# Patient Record
Sex: Female | Born: 1955 | Hispanic: No | Smoking: Former smoker
Health system: Western US, Academic
[De-identification: ages and names within clinical notes are randomized; demographics above are authoritative.]

## PROBLEM LIST (undated history)

## (undated) DIAGNOSIS — R2689 Other abnormalities of gait and mobility: Secondary | ICD-10-CM

## (undated) DIAGNOSIS — M1990 Unspecified osteoarthritis, unspecified site: Secondary | ICD-10-CM

## (undated) DIAGNOSIS — Z87898 Personal history of other specified conditions: Secondary | ICD-10-CM

## (undated) DIAGNOSIS — I10 Essential (primary) hypertension: Secondary | ICD-10-CM

## (undated) DIAGNOSIS — F99 Mental disorder, not otherwise specified: Secondary | ICD-10-CM

## (undated) HISTORY — PX: PR LAMINECTOMY W/O FFD 1/2 VERT SEG LUMBAR: 63005

## (undated) HISTORY — PX: PR NEPHRECTOMY PARTIAL: 50240

---

## 2019-04-11 ENCOUNTER — Inpatient Hospital Stay
Admission: AD | Admit: 2019-04-11 | Discharge: 2019-04-17 | DRG: 519 | Disposition: A | Discharge status: Skilled Nursing Facility | Payer: Commercial Managed Care - HMO | Source: Other Acute Inpatient Hospital | Attending: Orthopaedic Surgery | Admitting: Orthopaedic Surgery

## 2019-04-11 ENCOUNTER — Telehealth: Payer: Self-pay | Admitting: Neurology

## 2019-04-11 ENCOUNTER — Telehealth (HOSPITAL_BASED_OUTPATIENT_CLINIC_OR_DEPARTMENT_OTHER): Payer: Commercial Managed Care - HMO | Admitting: Neurology

## 2019-04-11 ENCOUNTER — Inpatient Hospital Stay (HOSPITAL_COMMUNITY): Payer: Commercial Managed Care - HMO

## 2019-04-11 DIAGNOSIS — F329 Major depressive disorder, single episode, unspecified: Secondary | ICD-10-CM | POA: Diagnosis present

## 2019-04-11 DIAGNOSIS — M48061 Spinal stenosis, lumbar region without neurogenic claudication: Secondary | ICD-10-CM

## 2019-04-11 DIAGNOSIS — G8220 Paraplegia, unspecified: Secondary | ICD-10-CM | POA: Diagnosis present

## 2019-04-11 DIAGNOSIS — E785 Hyperlipidemia, unspecified: Secondary | ICD-10-CM | POA: Diagnosis present

## 2019-04-11 DIAGNOSIS — M4802 Spinal stenosis, cervical region: Secondary | ICD-10-CM

## 2019-04-11 DIAGNOSIS — G839 Paralytic syndrome, unspecified: Secondary | ICD-10-CM

## 2019-04-11 DIAGNOSIS — M9983 Other biomechanical lesions of lumbar region: Secondary | ICD-10-CM

## 2019-04-11 DIAGNOSIS — G8222 Paraplegia, incomplete: Secondary | ICD-10-CM

## 2019-04-11 DIAGNOSIS — K5900 Constipation, unspecified: Secondary | ICD-10-CM | POA: Diagnosis present

## 2019-04-11 DIAGNOSIS — M62838 Other muscle spasm: Secondary | ICD-10-CM

## 2019-04-11 DIAGNOSIS — M5136 Other intervertebral disc degeneration, lumbar region: Secondary | ICD-10-CM

## 2019-04-11 DIAGNOSIS — G9589 Other specified diseases of spinal cord: Secondary | ICD-10-CM | POA: Diagnosis present

## 2019-04-11 DIAGNOSIS — E039 Hypothyroidism, unspecified: Secondary | ICD-10-CM | POA: Diagnosis present

## 2019-04-11 DIAGNOSIS — G4700 Insomnia, unspecified: Secondary | ICD-10-CM | POA: Diagnosis present

## 2019-04-11 DIAGNOSIS — F419 Anxiety disorder, unspecified: Secondary | ICD-10-CM | POA: Diagnosis present

## 2019-04-11 DIAGNOSIS — Z87891 Personal history of nicotine dependence: Secondary | ICD-10-CM

## 2019-04-11 DIAGNOSIS — Z6379 Other stressful life events affecting family and household: Secondary | ICD-10-CM

## 2019-04-11 DIAGNOSIS — Z981 Arthrodesis status: Secondary | ICD-10-CM

## 2019-04-11 DIAGNOSIS — R269 Unspecified abnormalities of gait and mobility: Secondary | ICD-10-CM

## 2019-04-11 DIAGNOSIS — Z1159 Encounter for screening for other viral diseases: Secondary | ICD-10-CM

## 2019-04-11 DIAGNOSIS — I10 Essential (primary) hypertension: Secondary | ICD-10-CM | POA: Diagnosis present

## 2019-04-11 HISTORY — DX: Other abnormalities of gait and mobility: R26.89

## 2019-04-11 HISTORY — DX: Personal history of other specified conditions: Z87.898

## 2019-04-11 HISTORY — DX: Essential (primary) hypertension: I10

## 2019-04-11 HISTORY — DX: Unspecified osteoarthritis, unspecified site: M19.90

## 2019-04-11 HISTORY — DX: Mental disorder, not otherwise specified: F99

## 2019-04-11 MED ORDER — ACETAMINOPHEN 325 MG TABLET
650.0000 mg | ORAL_TABLET | ORAL | Status: DC | PRN
Start: 2019-04-11 — End: 2019-04-13
  Administered 2019-04-12 – 2019-04-13 (×2): 650 mg via ORAL
  Filled 2019-04-11 (×2): qty 2, fill #0

## 2019-04-11 MED ORDER — ONDANSETRON HCL (PF) 4 MG/2 ML INJECTION SOLUTION
4.0000 mg | Freq: Two times a day (BID) | INTRAMUSCULAR | Status: DC | PRN
Start: 2019-04-11 — End: 2019-04-17
  Administered 2019-04-12 – 2019-04-13 (×2): 4 mg via INTRAVENOUS
  Filled 2019-04-11: qty 2, fill #0

## 2019-04-11 MED ORDER — METOCLOPRAMIDE 5 MG/ML INJECTION SOLUTION
10.0000 mg | Freq: Four times a day (QID) | INTRAMUSCULAR | Status: DC | PRN
Start: 2019-04-11 — End: 2019-04-17

## 2019-04-11 MED ORDER — HEPARIN (PORCINE) 5,000 UNIT/ML SOLUTION FOR INJECTION
5000.0000 [IU] | Freq: Three times a day (TID) | INTRAMUSCULAR | Status: DC
Start: 2019-04-11 — End: 2019-04-13
  Administered 2019-04-11 – 2019-04-12 (×2): 5000 [IU] via SUBCUTANEOUS
  Filled 2019-04-11 (×2): qty 1, fill #0

## 2019-04-11 MED ORDER — MAGNESIUM HYDROXIDE 400 MG/5 ML ORAL SUSPENSION
30.0000 mL | Freq: Two times a day (BID) | ORAL | Status: DC | PRN
Start: 2019-04-11 — End: 2019-04-13
  Administered 2019-04-12: 30 mL via ORAL
  Filled 2019-04-11: qty 30, fill #0

## 2019-04-11 MED ORDER — DOCUSATE SODIUM 100 MG CAPSULE
100.0000 mg | ORAL_CAPSULE | Freq: Two times a day (BID) | ORAL | Status: DC
Start: 2019-04-11 — End: 2019-04-13
  Administered 2019-04-11 – 2019-04-12 (×3): 100 mg via ORAL
  Filled 2019-04-11 (×3): qty 1, fill #0

## 2019-04-11 NOTE — Progress Notes (Signed)
Video Visit Note     I performed this clinical encounter by utilizing a real time telehealth video connection between my location and the patient's location. The patient's location was confirmed during this visit. I obtained verbal consent from the patient to perform this clinical encounter utilizing video and prepared the patient by answering any questions they had about the telehealth interaction.    Aided by her [facility] ER MD, Dr. Early Chars.    She has had a four day history of progressive leg weakness and spasticity on the background of a remote laminectomy of L3-4. No sensory levels and rectal tone normal on the exam. Her PP and temp sense are reduced at and below the knee, b/l. She is hyper reflexic with clonus LEs, and toes upgoing.  MRI L/S spine: no acute findings verbal report.   I/P: Spastic paraparesis of unknown etiology. Admit.    I spent a total of 44 minutes today on this patient's visit. This included 34 minutes of face to face time, more than 50% of which was spent in counseling or coordinating care, for the following medical problem(s): spasticity of LEs. The remainder of the time was spent on review of the patient record (including but not limited to clinical notes, outside records, laboratory and radiographic studies), medical decision-making, and documentation of the visit.    Ann Held, MD

## 2019-04-11 NOTE — Nurse Assessment (Signed)
ADMIT NURSING NOTE    Note Started: 04/11/2019, 22:57     Report received from [facility].. Patient admitted at 2100 hours as a direct admit and accompanied by transport. Pt condition stable . patient oriented to room and unit. MD came in the unit and saw pt.. safety and security measures provided, Admission Assessment and Plan of Care initiated. Rhonda Horton. Esmeralda Links, RN

## 2019-04-11 NOTE — Progress Notes (Signed)
The ER MD, Dr. Gaynell Face, called this morning, and reported that:     The patient had a four-day leg weakness at home, on the background of social difficulties, and family discord.    PmHX: Low back pain, chronic. NO history of injury or falls.     Examination by report:    Legs are rigid, and hyper extended, but mild to trace weakness.     DTRs: hypo on the left leg and normal on the right    Sensory is fully intact everywhere.     Babinski: not performed.    Impression: acute exacerbation of lower back pain and ?spinal stenosis.    Recommend:  1) check rectal tone,  2) check Babkinski,  3) order MRI L/S spine w/ and w/o,  4) outpatient EMG by local neurology services (Drs. Lanny Cramp, and [facility])  5) admit and observe,  6) call with any questions.     Time spent: 23 min    Ann Held, MD

## 2019-04-11 NOTE — H&P (Addendum)
NEUROLOGY RESIDENT PGY-2 ADMISSION    PATIENT:  Rhonda Horton  MRN:         [id number]    NOTE DATE / TIME:  04/12/2019  @ 14:59    REQUESTED BY:  No att. providers found   SERVICE / LOCATION:  Direct Admit  ADMISSION DATE:  04/12/2019    PCP is Dr Llana Aliment at [facility]    CHIEF COMPLAINT:  Leg weakness    HISTORY SOURCE:  patient    HISTORY OF PRESENT ILLNESS:      Rhonda Horton is a 63yr old female with past medical history of chronic low back pain who presented to outside hospital with complaints of 2 days of inability to walk. This has been going on for many months. She prefaces by saying she is very emotional and her memory of events may be slightly off.    She had "back surgery" in her early 38s, which helped for a while. Had another surgery 5 years ago for the same problem, with screws and plate in, and reports she was never right after this. After this, she started having gait abnormalities with slight dragging of her left foot. This worsened to where she needed a cane, has been to PT. Currently using a cane on the left and her "toes curl under" often, uses walker when out of the home. She has had numbness in association with this for years.     Most recent surgery was in [place] with Dr ?Thereasa Solo.     She does endorse recent social difficulties and stressors in her personal life, she has had a recent "nervous breakdown" 4 days ago with uncontrollable crying. She lives with her sister and roommate. She has not been allowed to come out of her room, has been cursed at and is fearful. Has not sustained any physical abuse. Several days after, she would try and stand up and could not walk.     At outside hospital, emergency room requested telemetry consult from neurology, who initially recommended checking rectal tone, MRI L-spine with and without contrast, reflexes, admission for observation.  There, they noted rectal tone was normal, proprioception and temperature reduced distal to knee bilaterally.   Noted hyperreflexia and upgoing toes.  Per verbal report given by [facility] emergency room doctor, no acute findings on MRI L-spine.     She feels her speech sounds slower to her. She endorses insomnia, depression related to home situation. No suicidal ideation or plans. No weapons at home. Denies fevers, chills, recent infection, blurry vision, nausea/vomiting/diarrhea, sick contacts.       REVIEW OF SYSTEMS:  Negative unless mentioned otherwise above.    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     Grandma with depression.    SOCIAL HISTORY:   T/E/D: former smoker, quit 3 months ago with wellbutrin, ~20 pack year hx. Recreational drugs. Drinks alcohol socially.    Occupation:  Risk analyst, caregiver and janitorial in the end  Lives with:  Divorced. 2 children, still in touch with 1. Both healthy. Lives with roommate and since March, lives with sister.    EXAMINATION:    Weight: Weight: 97.4 kg (214 lb 12.8 oz) (04/11/19 2101)     Vitals:     Current  Minimum Maximum   BP BP: 139/77  BP: (127-151)/(61-83)    Temp Temp: 37.3 C (99.1 F)  Temp Min: 36.8 C (98.3 F)  Temp Max: 37.5 C (99.5 F)    Pulse Pulse: 72 Pulse Min: 70  Pulse Max: 76    Resp Resp: 18 Resp Min: 17  Resp Max: 18    O2 Sat SpO2: 96 % SpO2 Min: 96 % SpO2 Max: 98 %   O2 Deliv None ; No Data Recorded      SpO2: 96 %  Pulse: 72    24 Hour Intake/Output:  I/O Last 2 Completed Shifts:  In: 300 [Oral:300]  Out: -     General Physical Exam:  General:  Female appearing stated age, no distress.   HEENT:  Atraumatic head,  normocephalic  Cardiovascular:  HDS on monitor, NSR  Respiratory:  Normal work of breathing   Abdominal:  Normoactive bowel sounds, soft, non-tender, non-distended  Extremities:  No cyanosis, clubbing, or edema    Mental Status:   State:  Alert, oriented to self, month/year and situation. Appropriately conversant. Tearful at times.  Use of language:  Fluent, intact expression and comprehension (follows commands). No paraphasias noted.    Cranial Nerves:  CN 2:        Pupils 3mm, equal round and reactive to light bilaterally.  Visual fields full to confrontation.    CN 3,4,6:  EOM intact without nystagmus.  CN 5:        Sensation to LT decreased 80% on L vs R.   CN 7:        Facial symmetry at rest and with movement.  Eye closure strength full.  CN 8:        Hearing intact to voice.  CN 9, 10:  Palate elevation midline.  CN 11:      Trapezius strength 5/5.  CN 12:      Tongue protrusion midline.    Motor:  Tone and bulk:  unremarkable    Adventitious movements: tremulousness which patient attributes to emotional stress  Pronator drift:  none      Deltoid Biceps Triceps Finger Flexors Finger Extensors   R 5 5 5 5 5    L 5 5 5 5 5       Hip Flexors Hip Extensors  Knee Flexors Knee Ext Dorsi- Flexion Plantar- Flexion   R 4 4 4 4 5 5    L 4 4 4 4  4- 4-     Reflexes:  3+ throughout with spread with upgoing plantars BL  Clonus in BL ankles to achilles reflex test  Sustained clonus LLE. 4-5 beats clonus RLE.    Sensory:  Light touch:  Intact but decreased (80%) on L vs 100% on R.   Temperature intact and symmetric bilaterally  No length dpdt changes in sensation       Coordination:  F-to-N:  Intact, no ataxia or dysmetria    H-to-S:  Intact, no ataxia or dysmetria  Rapid-alt movements: Smooth rhythm.      Cortical:  Extinction on DSS (neglect):  No extinction with simultaneous visual or tactile stimulation    Gait:  Unable to walk, markedly unsteady    DIAGNOSTIC STUDIES:  Lab  Results - 24 hours (excluding micro and POC)   C  DIFFICILE SURVEILLANCE TEST     Status: Normal   Result Value Status    Test Result NEGATIVE for C. difficile Final   CBC NO DIFFERENTIAL     Status: Normal   Result Value Status    White Blood Cell Count 6.4 Final    Red Blood Cell Count 4.26 Final    Hemoglobin 13.9 Final    Hematocrit 41.1 Final    MCV 96.4 Final    MCH 32.6 Final    MCHC 33.8 Final    RDW 13.3 Final    MPV 9.1 Final    Platelet Count 289 Final   COMPREHENSIVE METABOLIC PANEL     Status: Abnormal   Result Value Status    Sodium 140 Final    Potassium 3.6 Final    Chloride 106 Final    Carbon Dioxide Total 23 (L) Final    Urea Nitrogen, Blood (BUN) 18 Final    Creatinine Serum 0.97 Final    Glucose 174 (H) Final    Calcium 9.8 Final    Protein 6.6 Final    Albumin 3.8 Final    Alkaline Phosphatase (ALP) 69 Final    Aspartate Transaminase (AST) 24 Final    Bilirubin Total 0.4 Final    Alanine Transferase (ALT) 32 Final    E-GFR, African American (Female) 62 Final    E-GFR, Non-African American (Female) 27 Final   CREATINE KINASE     Status: Normal   Result Value Status    Creatine Kinase 35 Final   URINALYSIS AND CULTURE IF IND     Status: Abnormal   Result Value Status    COLLECTION CLEAN CATCH Final    COLOR Yellow Final    CLARITY Sl Turbid Final    SPECIFIC GRAVITY, URINE 1.023 Final    pH URINE 5.0 Final    OCCULT BLOOD URINE Negative Final    BILIRUBIN URINE Negative Final    KETONES Negative Final    GLUCOSE URINE Negative Final    PROTEIN URINE Negative Final    UROBILINOGEN 4.0 (Abnl) Final    NITRITE URINE Negative Final    LEUK ESTERASE Negative Final    MICROSCOPIC  Indicated Final    WBC, URINE 2 Final    RBC 3 Final    BACTERIA/HPF Few Final    SQUAMOUS EPI 3 Final    MUCOUS/LPF Few Final    HYALINE CASTS 2 Final    CALCIUM OXALATE Moderate (Abnl) Final    URINE CULTURE NOT INDICATED Final   TSH WITH FREE T4 REFLEX     Status: Normal   Result Value Status    Thyroid Stimulating Hormone 1.14 Final       SUMMARY & IMPRESSION:  This is a  63yr-old R-handed female with PMH of chronic LBP, s/p 2 lumbar surgeries and 1 cervical spine surgery, HTN, dyslipidemia who presented on 04/11/2019 as a transfer for paraparesis of unknown etiology. She reported 4 days of worsening weakness in bilateral lower extremities, symmetric.    Differential included structural injury (herniation of disc or other intramedullary lesion causing mass-effect on spinal cord), exacerbation of existing weakness due to emotional stress or other CNS process.  Patient does have known history of chronic low back pain, though prior imaging is not available.  Has not had any recent trauma, but is certainly possible that she could have worsening of stenosis or other structural problem.  Received  MRI at outside hospital, which was read as normal. She was transferred to [facility] for further work-up of this.    On exam, she was noted to have clonus bilaterally, L>R and symmetric hyperreflexia out of proportion throughout. Imaging was performed, including MRI C/T-spine. Orthopedics spine team was consulted.    Currently, we will start gabapentin for patient pain which she localizes to her back.  She does not have any radicular pain.    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  #Paraparesis  -MRI L spine results from OSH  -MRI C/T spine ordered w/wo contrast revealing significant C spine stenosis w cord signal abnormalities  -ortho spine consulted  -Gabapentin  TID   -can uptitrate as needed  -will hold DVT ppx currently until ortho recs are available, pending decision for surgery     #Hypertension  -PRN hydralazine  -Hydrochlorothiazide 12.5 mg daily  -Amlodipine 5 mg daily  -Metoprolol 50 mg twice daily    #Dyslipidemia  -Atorvastatin 20 mg     #Hypothyroidism  TSH normal  -Levothyroxine 137 mcg    #Depression  -Escitalopram 40 mg daily    #History of tobacco use, quit 3 months ago  -Wellbutrin 450 mg q. morning    Findings were discussed  in detail and plan was formulated with attending, Dr. Jake Church. Further recommendations may follow in attending addendum.    Report Electronically Signed By:  Josetta Huddle, DO  PGY-2, Department of Neurology  PI #: [id number]       Pager: [phone]  Neurology Consult Pager: [phone]  Pediatric Neurology Consult Pager: [phone]  Date: 04/12/2019          This patient was seen, evaluated, and care plan was developed with the resident.  I agree with the assessment and plan as outlined in the resident's note.  63 year old woman with a history of chronic lower back pain status post 2 lumbar spine surgeries with the last one in 2015 and cervical spine surgery in the 1990s presents for evaluation of paraparesis.  She has had a long history of bilateral lower extremity weakness for several years since her last lumbar spine surgery in 2015 with worsening of lower extremity weakness over the last 4 days secondary to argument at home.  Examination shows decreased sensory symptoms on the left face, arm, leg, hyperreflexia in all 4 extremities with 4-4+ weakness in the lower extremities bilaterally with increased tone in both legs.  She has a positive straight leg test worse on the left than the right.  Review of MRI L-spine did not show any evidence of acute changes but showed some postsurgical changes; will have images uploaded to our server and read by our radiology department for an official read. There is concern that this may be a chronic issue but will check MRI of the brain, C-spine, and T-spine.  We will have physical therapy evaluate patient.  Report electronically signed by Jake Church, MD. Attending

## 2019-04-12 ENCOUNTER — Inpatient Hospital Stay: Payer: Commercial Managed Care - HMO

## 2019-04-12 ENCOUNTER — Inpatient Hospital Stay (HOSPITAL_COMMUNITY): Payer: Commercial Managed Care - HMO

## 2019-04-12 DIAGNOSIS — M5384 Other specified dorsopathies, thoracic region: Secondary | ICD-10-CM

## 2019-04-12 DIAGNOSIS — Z981 Arthrodesis status: Secondary | ICD-10-CM

## 2019-04-12 DIAGNOSIS — M4802 Spinal stenosis, cervical region: Secondary | ICD-10-CM

## 2019-04-12 DIAGNOSIS — Z0389 Encounter for observation for other suspected diseases and conditions ruled out: Secondary | ICD-10-CM

## 2019-04-12 DIAGNOSIS — G9389 Other specified disorders of brain: Secondary | ICD-10-CM

## 2019-04-12 HISTORY — PX: LAMINOPLASTY, CERVICAL, POSTERIOR: SHX000992

## 2019-04-12 LAB — URINALYSIS AND CULTURE IF IND
Bilirubin Urine: NEGATIVE
Glucose Urine: NEGATIVE mg/dL
Hyaline Casts: 2 /LPF (ref ?–5)
Ketones: NEGATIVE mg/dL
Leuk Esterase: NEGATIVE
Nitrite Urine: NEGATIVE
Occult Blood Urine: NEGATIVE mg/dL
Protein Urine: NEGATIVE mg/dL
RBC: 3 /HPF (ref 0–5)
Specific Gravity, Urine: 1.023 (ref 1.002–1.030)
Squamous EPI: 3 /HPF (ref 0–5)
Urobilinogen: 4 mg/dL — AB (ref ?–2.0)
WBC, Urine: 2 /HPF (ref 0–5)
pH URINE: 5 (ref 4.8–7.8)

## 2019-04-12 LAB — TYPE AND SCREEN
Ab Scrn-Gel: NEGATIVE
Patient Blood Type: A POS

## 2019-04-12 LAB — CBC NO DIFFERENTIAL
Hematocrit: 41.1 % (ref 36.0–46.0)
Hemoglobin: 13.9 g/dL (ref 12.0–16.0)
MCH: 32.6 pg (ref 27.0–33.0)
MCHC: 33.8 % (ref 32.0–36.0)
MCV: 96.4 fL (ref 80.0–100.0)
MPV: 9.1 fL (ref 6.8–10.0)
Platelet Count: 289 10*3/uL (ref 130–400)
RDW: 13.3 % (ref 0.0–14.7)
Red Blood Cell Count: 4.26 10*6/uL (ref 4.00–5.20)
White Blood Cell Count: 6.4 10*3/uL (ref 4.5–11.0)

## 2019-04-12 LAB — COMPREHENSIVE METABOLIC PANEL
Alanine Transferase (ALT): 32 U/L (ref 5–54)
Albumin: 3.8 g/dL (ref 3.2–4.6)
Alkaline Phosphatase (ALP): 69 U/L (ref 35–115)
Aspartate Transaminase (AST): 24 U/L (ref 15–43)
Bilirubin Total: 0.4 mg/dL (ref 0.3–1.3)
Calcium: 9.8 mg/dL (ref 8.6–10.5)
Carbon Dioxide Total: 23 mmol/L — ABNORMAL LOW (ref 24–32)
Chloride: 106 mmol/L (ref 95–110)
Creatinine Serum: 0.97 mg/dL (ref 0.44–1.27)
E-GFR Creatinine (Female): 64 mL/min/{1.73_m2}
E-GFR, African American (Female): 73 mL/min/{1.73_m2}
Glucose: 174 mg/dL — ABNORMAL HIGH (ref 70–99)
Potassium: 3.6 mmol/L (ref 3.3–5.0)
Protein: 6.6 g/dL (ref 6.3–8.3)
Sodium: 140 mmol/L (ref 135–145)
Urea Nitrogen, Blood (BUN): 18 mg/dL (ref 8–22)

## 2019-04-12 LAB — UR DRUGS OF ABUSE SCREEN
Amphetamine Screen, Urine: NEGATIVE ng/mL
Barbiturates Screen, Urine: NEGATIVE
Benzodiazepines Screen, Urine: POSITIVE — AB
Cocaine Metabolite Scrn, Urine: NEGATIVE
Opiates Screen, Urine: POSITIVE — AB

## 2019-04-12 LAB — INR
INR: 1.05 (ref 0.87–1.18)
Prothrombin Time: 9.6 secs (ref 8.4–10.7)

## 2019-04-12 LAB — COVID-2019 RNA, QUAL: SARS-CoV-2: NOT DETECTED

## 2019-04-12 LAB — CREATINE KINASE: Creatine Kinase: 35 U/L (ref 0–250)

## 2019-04-12 LAB — C DIFFICILE SURVEILLANCE TEST: Test Result: NEGATIVE

## 2019-04-12 LAB — APTT STUDIES: aPTT: 30.8 secs (ref 28.8–39.9)

## 2019-04-12 LAB — TSH WITH FREE T4 REFLEX: Thyroid Stimulating Hormone: 1.14 u[IU]/mL (ref 0.35–3.30)

## 2019-04-12 MED ORDER — ATORVASTATIN 10 MG TABLET
20.0000 mg | ORAL_TABLET | Freq: Every day | ORAL | Status: DC
Start: 2019-04-12 — End: 2019-04-17
  Administered 2019-04-12 – 2019-04-16 (×5): 20 mg via ORAL
  Filled 2019-04-12 (×5): qty 2, fill #0

## 2019-04-12 MED ORDER — LEVOTHYROXINE 25 MCG TABLET
137.0000 ug | ORAL_TABLET | Freq: Every day | ORAL | Status: DC
Start: 2019-04-13 — End: 2019-04-17
  Administered 2019-04-14 – 2019-04-17 (×4): 137 ug via ORAL
  Filled 2019-04-12 (×6): qty 1, fill #0

## 2019-04-12 MED ORDER — MELATONIN 3 MG TABLET
3.0000 mg | ORAL_TABLET | Freq: Every day | ORAL | Status: DC | PRN
Start: 2019-04-12 — End: 2019-04-17
  Administered 2019-04-12 – 2019-04-16 (×5): 3 mg via ORAL
  Filled 2019-04-12 (×5): qty 1, fill #0

## 2019-04-12 MED ORDER — CEFAZOLIN 2 GRAM/100 ML IN DEXTROSE(ISO-OSMOTIC) INTRAVENOUS PIGGYBACK
2.0000 g | INJECTION | INTRAVENOUS | Status: AC
Start: 2019-04-12 — End: 2019-04-13
  Administered 2019-04-13: 2 g via INTRAVENOUS

## 2019-04-12 MED ORDER — GADOTERIDOL 279.3 MG/ML INTRAVENOUS SOLUTION - 10 ML VIAL
INTRAVENOUS | Status: AC
Start: 2019-04-12 — End: 2019-04-12
  Administered 2019-04-12: 16:00:00 via INTRAVENOUS

## 2019-04-12 MED ORDER — LORAZEPAM 2 MG/ML INJECTION SOLUTION
0.5000 mg | INTRAMUSCULAR | Status: DC
Start: 2019-04-12 — End: 2019-04-12
  Administered 2019-04-12: 1 mg via INTRAVENOUS
  Filled 2019-04-12: qty 1, fill #0

## 2019-04-12 MED ORDER — SODIUM CHLORIDE 0.9 % INTRAVENOUS SOLUTION
1500.0000 mg | INTRAVENOUS | Status: AC
Start: 2019-04-12 — End: 2019-04-13
  Administered 2019-04-13: 1500 mg via INTRAVENOUS
  Filled 2019-04-12: qty 1500, fill #0

## 2019-04-12 MED ORDER — PANTOPRAZOLE 40 MG TABLET,DELAYED RELEASE
40.0000 mg | DELAYED_RELEASE_TABLET | Freq: Every day | ORAL | Status: DC
Start: 2019-04-13 — End: 2019-04-17
  Administered 2019-04-14 – 2019-04-17 (×4): 40 mg via ORAL
  Filled 2019-04-12 (×4): qty 1, fill #0

## 2019-04-12 MED ORDER — POLYETHYLENE GLYCOL 3350 17 GRAM ORAL POWDER PACKET
17.0000 g | Freq: Two times a day (BID) | ORAL | Status: DC | PRN
Start: 2019-04-12 — End: 2019-04-17
  Administered 2019-04-17: 17 g via ORAL
  Filled 2019-04-12: qty 1, fill #0

## 2019-04-12 MED ORDER — METOPROLOL TARTRATE 25 MG TABLET
50.0000 mg | ORAL_TABLET | Freq: Two times a day (BID) | ORAL | Status: DC
Start: 2019-04-12 — End: 2019-04-17
  Administered 2019-04-12 – 2019-04-16 (×7): 50 mg via ORAL
  Filled 2019-04-12 (×8): qty 2, fill #0

## 2019-04-12 MED ORDER — HYDROCHLOROTHIAZIDE 12.5 MG TABLET
12.5000 mg | ORAL_TABLET | Freq: Every day | ORAL | Status: DC
Start: 2019-04-13 — End: 2019-04-17
  Administered 2019-04-14 – 2019-04-17 (×4): 12.5 mg via ORAL
  Filled 2019-04-12 (×4): qty 1, fill #0

## 2019-04-12 MED ORDER — LACTULOSE 10 GRAM/15 ML (15 ML) ORAL SOLUTION
30.0000 mL | Freq: Two times a day (BID) | ORAL | Status: DC
Start: 2019-04-12 — End: 2019-04-12

## 2019-04-12 MED ORDER — ESCITALOPRAM 20 MG TABLET
40.0000 mg | ORAL_TABLET | Freq: Every day | ORAL | Status: DC
Start: 2019-04-13 — End: 2019-04-17
  Administered 2019-04-14 – 2019-04-17 (×4): 40 mg via ORAL
  Filled 2019-04-12 (×5): qty 2, fill #0

## 2019-04-12 MED ORDER — HYDRALAZINE 20 MG/ML INJECTION SOLUTION
5.0000 mg | Freq: Four times a day (QID) | INTRAMUSCULAR | Status: DC | PRN
Start: 2019-04-12 — End: 2019-04-17

## 2019-04-12 MED ORDER — LORAZEPAM 2 MG/ML INJECTION SOLUTION
0.5000 mg | Freq: Once | INTRAMUSCULAR | Status: AC
Start: 2019-04-12 — End: 2019-04-12
  Administered 2019-04-12: 1 mg via INTRAVENOUS
  Filled 2019-04-12: qty 1, fill #0

## 2019-04-12 MED ORDER — AMLODIPINE 5 MG TABLET
5.0000 mg | ORAL_TABLET | Freq: Every day | ORAL | Status: DC
Start: 2019-04-13 — End: 2019-04-17
  Administered 2019-04-14 – 2019-04-17 (×4): 5 mg via ORAL
  Filled 2019-04-12 (×4): qty 1, fill #0

## 2019-04-12 MED ORDER — GABAPENTIN 100 MG CAPSULE
100.0000 mg | ORAL_CAPSULE | Freq: Three times a day (TID) | ORAL | Status: DC
Start: 2019-04-12 — End: 2019-04-13
  Administered 2019-04-12 (×2): 100 mg via ORAL
  Filled 2019-04-12 (×2): qty 1, fill #0

## 2019-04-12 MED ORDER — BUPROPION HCL XL 150 MG 24 HR TABLET, EXTENDED RELEASE
300.0000 mg | EXTENDED_RELEASE_TABLET | Freq: Every day | ORAL | Status: DC
Start: 2019-04-13 — End: 2019-04-13

## 2019-04-12 NOTE — Consults (Addendum)
ORTHOPAEDIC SURGERY SPINE CONSULT/H&P NOTE:    PATIENT: Rhonda Horton  MR#: [id number]  SEX: female, AGE: 89yr  DATE OF BIRTH: 03/12/1956  SERVICE DATE: 04/12/2019    PRIMARY ATTENDING:  Odis Hollingshead, MD    REASON FOR CONSULT:  New onset bilateral lower extremity weakness and cervical stenosis    Time received consult: 1458  Patient seen at: 1830    HPI:  Rhonda Horton is a 63yr old female with history of hypertension, hypothyroidism, depression, and chronic low back pain who presents with progressive weakness in the bilateral lower extremities.  She said that 5 years ago she underwent L4-5 posterior spinal fusion and what sounds like C4-5 ACDF by surgeon in [place].  1 year after the surgery, she started developing weakness in her left foot.  Her weakness progressed more proximally into her thigh and was also associated with tightness.  Over time, her right lower extremity also started becoming weak.  This all culminated in 2 days ago when she collapsed to the floor, being unable to walk.  She denies any bowel or bladder incontinence.  She denies any new numbness or tingling in her bilateral lower extremities.  She initially presented to [facility] and she was subsequently transferred here to the neurology service.  An MRI of the brain was obtained and demonstrated no acute intracranial process.  MRI of the cervical spine did demonstrate cord compression, for which orthopedic spine was consulted.    Mechanism: Insidious    Past Medical History:   Diagnosis Date    Arthritis     Balance problem     H/O shortness of breath     Hypertension     pt denied, but pt taking HTN meds    Psychiatric illness     pt denied pschye illness, but pt is taking multiple psyche meds, pt claimed it's for nervous breakdown     Past Surgical History:   Procedure Laterality Date    PR LAM W/O FACETEC FORAMOT/DSKC 1/2 VRT SEG, LUMBAR      Laminectomy, lumbar    PR PARTIAL REMOVAL OF KIDNEY      Nephrectromy     No  family history on file.  Social History     Occupational History    Not on file   Tobacco Use    Smoking status: Former Smoker     Types: Cigarettes    Smokeless tobacco: Former Neurosurgeon     Types: Snuff   Substance and Sexual Activity    Alcohol use: Yes     Alcohol/week: 3.0 standard drinks     Types: 3 Glasses of wine per week     Comment: monthly    Drug use: Never    Sexual activity: Not on file       There is no immunization history on file for this patient.  No Known Allergies    Medications:   No current facility-administered medications on file prior to encounter.      Current Outpatient Medications on File Prior to Encounter   Medication Sig Dispense Refill    AMLODIPINE BESYLATE, BULK, MISC Take 5 mg by mouth every day.      Atorvastatin (LIPITOR) 20 mg Tablet Take 20 mg by mouth every day.      BuPROPion (WELLBUTRIN XL) 150 mg XL tablet Take 150 mg by mouth every morning.      BuPROPion (WELLBUTRIN XL) 300 mg XL tablet Take 300 mg by mouth every morning.  Cholecalciferol, Vitamin D3, (VITAMIN D-3) 10 mcg (400 unit) Capsule Take 1 tablet by mouth every day.      ClonazePAM (KLONOPIN) 0.5 mg Tablet Take 0.5 mg by mouth 2 times daily.      Hydrochlorothiazide (MICROZIDE) 12.5 mg capsule Take 12.5 mg by mouth every morning.      Hydrocodone 10 mg/Acetaminophen 325 mg (NORCO) Tablet Take 1 tablet by mouth two times daily if needed.      LevoTHYROxine 137 mcg tablet Take 137 mcg by mouth every morning before a meal.      MECLIZINE HCL, BULK, MISC Take 12.5 mg by mouth two times daily if needed.      Metoprolol Tartrate (LOPRESSOR) 50 mg Tablet Take 50 mg by mouth 2 times daily.      Omeprazole (PRILOSEC) 20 mg Delayed Release Capsule Take 20 mg by mouth once daily before a meal.      Oxybutynin (DITROPAN) 5 mg Tablet Take 5 mg by mouth 3 times daily.      Topiramate (TOPAMAX) 50 mg tablet Take 50 mg by mouth 2 times daily.      Medications Prior to Admission   Medication    AMLODIPINE  BESYLATE, BULK, MISC    Atorvastatin (LIPITOR) 20 mg Tablet    BuPROPion (WELLBUTRIN XL) 150 mg XL tablet    BuPROPion (WELLBUTRIN XL) 300 mg XL tablet    Cholecalciferol, Vitamin D3, (VITAMIN D-3) 10 mcg (400 unit) Capsule    ClonazePAM (KLONOPIN) 0.5 mg Tablet    Hydrochlorothiazide (MICROZIDE) 12.5 mg capsule    Hydrocodone 10 mg/Acetaminophen 325 mg (NORCO) Tablet    LevoTHYROxine 137 mcg tablet    MECLIZINE HCL, BULK, MISC    Metoprolol Tartrate (LOPRESSOR) 50 mg Tablet    Omeprazole (PRILOSEC) 20 mg Delayed Release Capsule    Oxybutynin (DITROPAN) 5 mg Tablet    Topiramate (TOPAMAX) 50 mg tablet     Current Medications:   Current Facility-Administered Medications:     Acetaminophen (TYLENOL) Tablet 650 mg, 650 mg, ORAL, Q4H PRN, Fernand Parkins, DO, 650 mg at 04/12/19 0409    Docusate (COLACE) Capsule 100 mg, 100 mg, ORAL, BID, Sherlie Ban M, DO, 100 mg at 04/12/19 4098    Gabapentin (NEURONTIN) Capsule 100 mg, 100 mg, ORAL, TID, Patel, Hemali, DO, 100 mg at 04/12/19 1121    Heparin 5000 units/mL Injection 5,000 Units, 5,000 Units, SUBCUTANEOUS, Q8H (11,91,47), Fernand Parkins, DO, 5,000 Units at 04/12/19 8295    Lactulose (CHRONULAC) 10 gram/15 mL Solution 30 mL, 30 mL, ORAL, BID, Patel, Hemali, DO    Magnesium Hydroxide (MILK OF MAGNESIA) 400 mg/5 mL Suspension 30 mL, 30 mL, ORAL, Q12H PRN, Fernand Parkins, DO, 30 mL at 04/12/19 0913    Metoclopramide (REGLAN) Injection 10 mg, 10 mg, IV, Q6H PRN, Fernand Parkins, DO    Ondansetron [facility]) Injection 4 mg, 4 mg, IV, Q12H PRN, Fernand Parkins, DO, 4 mg at 04/12/19 6213   The patient's past medical, family, and social history was reviewed and confirmed.    REVIEW OF SYMPTOMS:  ROS: Per HPI    VITALS:  Vitals:    04/12/19 0405 04/12/19 0759 04/12/19 0953 04/12/19 1125   BP: 151/82 134/83 127/61 139/77   Pulse: 73 76  72   Resp: Temp: 36.9 C (98.4 F) 36.8 C (98.3 F)  37.3 C (99.1 F)   TempSrc: Oral Oral  Oral   SpO2:  98% 96%  96%   Weight:       Height:           EXAM:  General: NAD  Chest: Non-labored breathing  CV: RRR    Spine: No step offs or gross deformity  Tenderness to palpation in the cervical, thoracic, and mostly in the lumbar spine  No open wounds  Rectal exam - normal tone, normal perianal sensation    Bilateral upper extremities:  Fingers warm, well perfused, rapid cap refill  2+ radial pulse  Sensory function: 1/2 sensation to light touch in BUE dermatomes C5-T1  Motor function:   Movement  Shoulder abduction   elbow flexion   Elbow flexion    wrist extension   Elbow extension    wrist flexion    Finger extension   Finger flexion,   finger  adduction  Finger intrinsics         Root  C5  C6  C7  C8  T1    Nerve  axillary, suprascapular,   musculocutaneous  radial,   musculocutaneous  median,   radial  median, ulnar  median, ulnar    Muscle  deltoid, biceps  biceps radialis  extensor carpi    longus/ brevis  triceps,    flexor carpi    radialis/ ulnaris  FDP   radialis/ulnaris/   superficialis  interossei    dorsal/palmar    Right   5/5   5/5   5/5   5/5   5/5    Left   5/5   4/5   5/5   5/5   5/5     Reflexes: biceps 2+, brachioradialis 2+  Hoffmans: Positive on the left side      Bilateral lower extremity:  Toes warm, well perfused, rapid cap refill  2+ DP pulses  Sensory function: 2/2 sensation to light touch in BLE dermatomes L2-L3, 1/2 sensation to light touch in BLE dermatomes L4-S1  Motor function:   Movement  Hip    flexion  Knee   flexion  Knee    extension   Ankle    dorsiflexion  Ankle   plantar   flexion   Big toe    extension     Root  L1/ 2/ 3  L5/ S1/ S2  L2/ 3/ 4  L4/ 5  S1/ 2  L5/ S1    Nerve  femoral/    spinal n.  sciatic     femoral  deep    peroneal  tibial  deep    peroneal    Muscle  iliopsoas  hamstrings  quads  tibialis   anterior  gastrocnemius ,   soleus  EHL    Right   3/5   3/5   3/5   4/5   4/5   4/5    Left   3/5   3/5   3/5   3/5   3/5   3/5     Reflexes: patellar 3+ bilaterally,  achilles 4+ bilaterally  Babinski: Positive bilaterally  Clonus: Over 30 beats of sustained clonus in the left lower extremity, over 10 beats of sustained clonus in the right lower extremity    Secondary Survey:  Other extremities and axial skeleton appear uninjured and patient denies pain or tenderness with palpation or motion.    LABS:  CBC:   Lab Results   Lab Name Value Date/Time    WBC 6.4 04/12/2019 09:06 AM    HGB 13.9 04/12/2019 09:06 AM  HCT 41.1 04/12/2019 09:06 AM    PLT 289 04/12/2019 09:06 AM     CHEM7:   Lab Results   Lab Name Value Date/Time    NA 140 04/12/2019 09:06 AM    K 3.6 04/12/2019 09:06 AM    CL 106 04/12/2019 09:06 AM    CO2 23 (L) 04/12/2019 09:06 AM    BUN 18 04/12/2019 09:06 AM    CR 0.97 04/12/2019 09:06 AM    GLU 174 (H) 04/12/2019 09:06 AM     INR:   No results found for this basename: INR:* in the last 24 hours     IMAGING:  MRI of the cervical spine demonstrates significant cord compression from C3-C6 with cord edema.  The MRI of the thoracic spine demonstrates no cord compression.  MRI of the lumbar spine obtained the outside hospital demonstrates foraminal stenosis at L4-5 with no central canal stenosis.    ASSESSMENT:  Nekita Merwin is a 63yr old female presenting with progressive bilateral lower extremity weakness and upper motor neuron signs consistent with myelopathy in the setting of cervical spine cord compression.  The patient would benefit from a decompression procedure the cervical spine to help alleviate her myelopathy.      PLAN:  #Cervical spine stenosis and myelopathy  - Treatment provided: Exam  - Recommendations: Pending C3-C6 versus C3-C7 laminoplasty with the orthopedic spine team.  Likely will go to the operating room tomorrow with Dr. Heron Sabins.  Please make patient n.p.o. after midnight.  Patient will also need a type and screen and coagulation labs (ordered).  - Weight bearing status: Weightbearing as tolerated to bilateral lower extremities.  No spine  precautions.  - Antibiotics: On-call to the OR  - Patient was discussed with and plan developed with orthopedic spine fellow, Dr. Benna Dunks.    This patient will be followed by the orthopedic spine team while in house.  For questions, please page:    Orthopaedic Spine Contact List  Primary: [facility]): NP [phone]      Thu - Sat (6AM-3:30PM): NP [phone]  2nd Call: [facility] [id number]  3rd Call: Eilleen Kempf [phone]    For spine imaging for evaluation of stability and plan please look up the on-call spine fellow and page the correct fellow on call:    Benna Dunks [phone]  Conley Rolls [id number]      Cross-cover: [id number] 6PM-6AM M-F; starting at 3:30 PM Saturday and through all day Sunday  (This service has an in house resident covering all orthopaedic patients. Please limit pages between the hours of 6pm to 6am to critical issues.)      Debbe Bales, M.D.  PGY2, Department of Orthopaedic Surgery  Pager #: [phone]    Fellow Attestation  Agree with the above-stated patient assessment and management by Dr. Vanita Panda, with any exceptions noted below. I was directly involved in the patient care and decision-making process overnight. Clinical presentation and associated radiographic imaging reviewed and discussed. Progressive weakness in BLE with falls, UMN symptoms, and cervical myelopathy. Radiographically, has cervical stenosis from C3-C6/C7 with associated focal myelomalacia. Plan for surgical decompression via C3-C6 vs. C3-C7 laminoplasty. NPO for OR.    Feel free to contact with any questions or concerns regarding patient management. Attd: Nila Nephew, MD  Orthopaedic Spine Fellow, Pager# [phone]

## 2019-04-12 NOTE — Nurse Assessment (Signed)
ASSESSMENT NOTE    Note Started: 04/12/2019, 08:02     Initial assessment completed and recorded in EMR.  Report received from night shift nurse and orders reviewed. Plan of Care reviewed and appropriate, discussed with patient.  Holland Falling, RN RN
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
## 2019-04-12 NOTE — Nurse Assessment (Signed)
ASSESSMENT NOTE    Note Started: 04/12/2019, 19:03     Initial assessment completed and recorded in EMR.  Report received from day shift nurse and orders reviewed. Plan of Care reviewed and updated, discussed with patient.  Erinne Gillentine de Lawson Radar, RN RN

## 2019-04-12 NOTE — Plan of Care (Signed)
Evaluation Note For All Goals  Patient had chest xray, MRI (brain & spine). NPO tonight for possible ortho procedure. Needs: PT, OT, SW. Sent labs and urine. 3 BM's.  Problem: Patient Care Overview  Goal: Plan of Care Review  04/12/2019 1734 by Holland Falling, RN  Outcome: Ongoing (interventions implemented as appropriate)  04/12/2019 1734 by Holland Falling, RN  Outcome: Ongoing (interventions implemented as appropriate)  Goal: Individualization and Mutuality  04/12/2019 1734 by Holland Falling, RN  Outcome: Ongoing (interventions implemented as appropriate)  04/12/2019 1734 by Holland Falling, RN  Outcome: Ongoing (interventions implemented as appropriate)  Goal: Discharge Needs Assessment  04/12/2019 1734 by Holland Falling, RN  Outcome: Ongoing (interventions implemented as appropriate)  04/12/2019 1734 by Holland Falling, RN  Outcome: Ongoing (interventions implemented as appropriate)  Goal: Interprofessional Rounds/Family Conf  04/12/2019 1734 by Holland Falling, RN  Outcome: Ongoing (interventions implemented as appropriate)  04/12/2019 1734 by Holland Falling, RN  Outcome: Ongoing (interventions implemented as appropriate)     Problem: Pain, Acute (Adult)  Goal: Identify Related Risk Factors and Signs and Symptoms  Description: Related risk factors and signs and symptoms are identified upon initiation of Human Response Clinical Practice Guideline (CPG).  04/12/2019 1734 by Holland Falling, RN  Outcome: Ongoing (interventions implemented as appropriate)  04/12/2019 1734 by Holland Falling, RN  Outcome: Ongoing (interventions implemented as appropriate)  Goal: Acceptable Pain Control/Comfort Level  Description: Patient will demonstrate the desired outcomes by discharge/transition of care.  04/12/2019 1734 by Holland Falling, RN  Outcome: Ongoing (interventions implemented as appropriate)  04/12/2019 1734 by Holland Falling, RN  Outcome: Ongoing (interventions implemented as appropriate)     Problem: Mobility, Physical  Impaired (Adult)  Goal: Identify Related Risk Factors and Signs and Symptoms  Description: Related risk factors and signs and symptoms are identified upon initiation of Human Response Clinical Practice Guideline (CPG).  04/12/2019 1734 by Holland Falling, RN  Outcome: Ongoing (interventions implemented as appropriate)  04/12/2019 1734 by Holland Falling, RN  Outcome: Ongoing (interventions implemented as appropriate)  Goal: Enhanced Mobility Skills  Description: Patient will demonstrate the desired outcomes by discharge/transition of care.  04/12/2019 1734 by Holland Falling, RN  Outcome: Ongoing (interventions implemented as appropriate)  04/12/2019 1734 by Holland Falling, RN  Outcome: Ongoing (interventions implemented as appropriate)  Goal: Enhanced Functional Ability  Description: Patient will demonstrate the desired outcomes by discharge/transition of care.  04/12/2019 1734 by Holland Falling, RN  Outcome: Ongoing (interventions implemented as appropriate)  04/12/2019 1734 by Holland Falling, RN  Outcome: Ongoing (interventions implemented as appropriate)

## 2019-04-13 ENCOUNTER — Inpatient Hospital Stay: Payer: Commercial Managed Care - HMO | Admitting: Certified Registered"

## 2019-04-13 ENCOUNTER — Inpatient Hospital Stay: Payer: Commercial Managed Care - HMO

## 2019-04-13 ENCOUNTER — Inpatient Hospital Stay (HOSPITAL_COMMUNITY): Payer: Commercial Managed Care - HMO | Admitting: Certified Registered"

## 2019-04-13 ENCOUNTER — Encounter
Admission: AD | Disposition: A | Discharge status: Skilled Nursing Facility | Payer: Self-pay | Source: Emergency Department | Attending: Orthopaedic Surgery

## 2019-04-13 DIAGNOSIS — G959 Disease of spinal cord, unspecified: Secondary | ICD-10-CM

## 2019-04-13 DIAGNOSIS — Z87891 Personal history of nicotine dependence: Secondary | ICD-10-CM

## 2019-04-13 DIAGNOSIS — I10 Essential (primary) hypertension: Secondary | ICD-10-CM

## 2019-04-13 DIAGNOSIS — M4712 Other spondylosis with myelopathy, cervical region: Secondary | ICD-10-CM

## 2019-04-13 DIAGNOSIS — F329 Major depressive disorder, single episode, unspecified: Secondary | ICD-10-CM

## 2019-04-13 DIAGNOSIS — G9529 Other cord compression: Secondary | ICD-10-CM

## 2019-04-13 DIAGNOSIS — M4802 Spinal stenosis, cervical region: Secondary | ICD-10-CM

## 2019-04-13 DIAGNOSIS — G8220 Paraplegia, unspecified: Secondary | ICD-10-CM

## 2019-04-13 DIAGNOSIS — E039 Hypothyroidism, unspecified: Secondary | ICD-10-CM

## 2019-04-13 DIAGNOSIS — E785 Hyperlipidemia, unspecified: Secondary | ICD-10-CM

## 2019-04-13 LAB — CBC WITH DIFFERENTIAL
Basophils % Auto: 0.1 %
Basophils Abs Auto: 0 10*3/uL (ref 0.0–0.2)
Eosinophils % Auto: 0 %
Eosinophils Abs Auto: 0 10*3/uL (ref 0.0–0.5)
Hematocrit: 32.7 % — ABNORMAL LOW (ref 36.0–46.0)
Hemoglobin: 11.3 g/dL — ABNORMAL LOW (ref 12.0–16.0)
Lymphocytes % Auto: 2.7 %
Lymphocytes Abs Auto: 0.5 10*3/uL — ABNORMAL LOW (ref 1.0–4.8)
MCH: 32.9 pg (ref 27.0–33.0)
MCHC: 34.6 % (ref 32.0–36.0)
MCV: 95.2 fL (ref 80.0–100.0)
MPV: 9.4 fL (ref 6.8–10.0)
Monocytes % Auto: 1.4 %
Monocytes Abs Auto: 0.2 10*3/uL (ref 0.1–0.8)
Neutrophils % Auto: 95.8 %
Neutrophils Abs Auto: 16.6 10*3/uL — ABNORMAL HIGH (ref 1.8–7.7)
Platelet Count: 264 10*3/uL (ref 130–400)
RDW: 13.1 % (ref 0.0–14.7)
Red Blood Cell Count: 3.43 10*6/uL — ABNORMAL LOW (ref 4.00–5.20)
White Blood Cell Count: 17.3 10*3/uL — ABNORMAL HIGH (ref 4.5–11.0)

## 2019-04-13 LAB — BASIC METABOLIC PANEL
Calcium: 9.6 mg/dL (ref 8.6–10.5)
Carbon Dioxide Total: 25 mmol/L (ref 24–32)
Chloride: 107 mmol/L (ref 95–110)
Creatinine Serum: 0.93 mg/dL (ref 0.44–1.27)
E-GFR Creatinine (Female): 67 mL/min/{1.73_m2}
E-GFR, African American (Female): 77 mL/min/{1.73_m2}
Glucose: 113 mg/dL — ABNORMAL HIGH (ref 70–99)
Potassium: 4 mmol/L (ref 3.3–5.0)
Sodium: 141 mmol/L (ref 135–145)
Urea Nitrogen, Blood (BUN): 17 mg/dL (ref 8–22)

## 2019-04-13 LAB — CBC NO DIFFERENTIAL
Hematocrit: 40 % (ref 36.0–46.0)
Hemoglobin: 13.7 g/dL (ref 12.0–16.0)
MCH: 32.8 pg (ref 27.0–33.0)
MCHC: 34.1 % (ref 32.0–36.0)
MCV: 96.1 fL (ref 80.0–100.0)
MPV: 9.3 fL (ref 6.8–10.0)
Platelet Count: 249 10*3/uL (ref 130–400)
RDW: 13.3 % (ref 0.0–14.7)
Red Blood Cell Count: 4.17 10*6/uL (ref 4.00–5.20)
White Blood Cell Count: 9.2 10*3/uL (ref 4.5–11.0)

## 2019-04-13 LAB — BLOOD TYPE VERIFICATION: Patient Blood Type: A POS

## 2019-04-13 LAB — HEMOGLOBIN A1C
Hgb A1C,Glucose Est Avg: 114 mg/dL
Hgb A1C: 5.6 % (ref 3.9–5.6)

## 2019-04-13 LAB — CULTURE SURVEILLANCE, MRSA

## 2019-04-13 LAB — MAGNESIUM (MG): Magnesium (Mg): 2.1 mg/dL (ref 1.5–2.6)

## 2019-04-13 LAB — PHOSPHORUS (PO4): Phosphorus (PO4): 3.4 mg/dL (ref 2.4–5.0)

## 2019-04-13 SURGERY — LAMINOPLASTY, SPINE, CERVICAL, POSTERIOR APPROACH
Anesthesia: General | Site: Spine Cervical | Wound class: Clean

## 2019-04-13 MED ORDER — ELECTROLYTE-A INTRAVENOUS SOLUTION
INTRAVENOUS | Status: DC | PRN
Start: 2019-04-13 — End: 2019-04-13
  Administered 2019-04-13: 10:00:00 via INTRAVENOUS

## 2019-04-13 MED ORDER — BUPIVACAINE (PF) 0.25 % (2.5 MG/ML) INJECTION SOLUTION
INTRAMUSCULAR | Status: AC
Start: 2019-04-13 — End: 2019-04-13
  Filled 2019-04-13: qty 30, fill #0

## 2019-04-13 MED ORDER — MAGNESIUM SULFATE 2 GRAM/50 ML (4 %) IN WATER INTRAVENOUS PIGGYBACK
2.0000 g | INJECTION | INTRAVENOUS | Status: DC | PRN
Start: 2019-04-13 — End: 2019-04-17

## 2019-04-13 MED ORDER — ENOXAPARIN 40 MG/0.4 ML SUBCUTANEOUS SYRINGE
40.0000 mg | INJECTION | SUBCUTANEOUS | Status: DC
Start: 2019-04-15 — End: 2019-04-17
  Administered 2019-04-15 – 2019-04-17 (×3): 40 mg via SUBCUTANEOUS
  Filled 2019-04-13 (×3): qty 0.4, fill #0

## 2019-04-13 MED ORDER — DEXAMETHASONE SODIUM PHOSPHATE 4 MG/ML INJECTION SOLUTION
10.0000 mg | Freq: Four times a day (QID) | INTRAMUSCULAR | Status: AC
Start: 2019-04-13 — End: 2019-04-15
  Administered 2019-04-13 – 2019-04-15 (×8): 10 mg via INTRAVENOUS
  Filled 2019-04-13: qty 3, fill #0
  Filled 2019-04-13: qty 2.5, fill #0
  Filled 2019-04-13 (×6): qty 3, fill #0

## 2019-04-13 MED ORDER — GABAPENTIN 300 MG CAPSULE
300.0000 mg | ORAL_CAPSULE | Freq: Three times a day (TID) | ORAL | Status: DC
Start: 2019-04-13 — End: 2019-04-14
  Administered 2019-04-13 – 2019-04-14 (×3): 300 mg via ORAL
  Filled 2019-04-13 (×3): qty 1, fill #0

## 2019-04-13 MED ORDER — SENNOSIDES 8.6 MG TABLET
2.0000 | ORAL_TABLET | Freq: Every day | ORAL | Status: DC
Start: 2019-04-13 — End: 2019-04-17
  Administered 2019-04-13 – 2019-04-16 (×2): 17.2 mg via ORAL
  Filled 2019-04-13 (×4): qty 2, fill #0

## 2019-04-13 MED ORDER — SUCCINYLCHOLINE(PF)200 MG/10 ML(20 MG/ML)-NACL,ISO INTRAVENOUS SYRINGE
INJECTION | INTRAVENOUS | Status: DC | PRN
Start: 2019-04-13 — End: 2019-04-13
  Administered 2019-04-13: 09:00:00 200 mg via INTRAVENOUS

## 2019-04-13 MED ORDER — ACETAMINOPHEN 1,000 MG/100 ML (10 MG/ML) INTRAVENOUS SOLUTION
INTRAVENOUS | Status: DC | PRN
Start: 2019-04-13 — End: 2019-04-13
  Administered 2019-04-13: 13:00:00 1000 mg via INTRAVENOUS

## 2019-04-13 MED ORDER — EPINEPHRINE HCL (PF) 1 MG/ML (1 ML) INJECTION SOLUTION
INTRAMUSCULAR | Status: AC
Start: 2019-04-13 — End: 2019-04-13
  Filled 2019-04-13: qty 1, fill #0

## 2019-04-13 MED ORDER — INTRAOP PROPOFOL INJ 20 ML VIAL (BOLUS+INFUSION)
Status: DC | PRN
Start: 2019-04-13 — End: 2019-04-13
  Administered 2019-04-13: 40 mg via INTRAVENOUS
  Administered 2019-04-13: 160 mg via INTRAVENOUS

## 2019-04-13 MED ORDER — OXYCODONE 10 MG/0.5 ML ORAL SYRINGE (FOR ORAL USE ONLY)
5.0000 mg | INJECTION | Freq: Once | ORAL | Status: AC
Start: 2019-04-13 — End: 2019-04-13
  Administered 2019-04-13: 5 mg via ORAL
  Filled 2019-04-13: qty 0.5, fill #0

## 2019-04-13 MED ORDER — INTRAOP PROPOFOL INJ 100 ML VIAL (BOLUS+INFUSION)
Status: DC | PRN
Start: 2019-04-13 — End: 2019-04-13
  Administered 2019-04-13: 10:00:00 125 ug/kg/min via INTRAVENOUS

## 2019-04-13 MED ORDER — HYDROMORPHONE 1 MG/ML INJECTION SYRINGE
0.2000 mg | INJECTION | INTRAMUSCULAR | Status: DC | PRN
Start: 2019-04-13 — End: 2019-04-13

## 2019-04-13 MED ORDER — LIDOCAINE HCL 10 MG/ML (1 %) INJECTION SOLUTION
0.1000 mL | INTRAMUSCULAR | Status: DC | PRN
Start: 2019-04-13 — End: 2019-04-13
  Administered 2019-04-13: 0.1 mL

## 2019-04-13 MED ORDER — FENTANYL (PF) 50 MCG/ML INJECTION SOLUTION
INTRAMUSCULAR | Status: AC
Start: 2019-04-13 — End: 2019-04-13
  Filled 2019-04-13: qty 5, fill #0

## 2019-04-13 MED ORDER — INTRAOP FENTANYL 50 MCG/ML INJ 5 ML AMP
Status: DC | PRN
Start: 2019-04-13 — End: 2019-04-13
  Administered 2019-04-13: 100 ug via INTRAVENOUS
  Administered 2019-04-13 (×3): 50 ug via INTRAVENOUS
  Administered 2019-04-13: 100 ug via INTRAVENOUS

## 2019-04-13 MED ORDER — ACETAMINOPHEN 1,000 MG/100 ML (10 MG/ML) INTRAVENOUS SOLUTION
1000.0000 mg | Freq: Four times a day (QID) | INTRAVENOUS | Status: AC
Start: 2019-04-13 — End: 2019-04-13
  Administered 2019-04-13 (×2): 1000 mg via INTRAVENOUS
  Filled 2019-04-13 (×2): qty 100, fill #0

## 2019-04-13 MED ORDER — DIAZEPAM 5 MG TABLET
2.5000 mg | ORAL_TABLET | Freq: Four times a day (QID) | ORAL | Status: DC | PRN
Start: 2019-04-13 — End: 2019-04-17
  Administered 2019-04-13: 5 mg via ORAL
  Filled 2019-04-13: qty 1, fill #0

## 2019-04-13 MED ORDER — VANCOMYCIN 1,000 MG INTRAVENOUS INJECTION
INTRAVENOUS | Status: DC | PRN
Start: 2019-04-13 — End: 2019-04-13
  Administered 2019-04-13: 12:00:00 1000 mg via TOPICAL

## 2019-04-13 MED ORDER — LIDOCAINE HCL 20 MG/ML (2 %) INJECTION SOLUTION
INTRAMUSCULAR | Status: DC | PRN
Start: 2019-04-13 — End: 2019-04-13
  Administered 2019-04-13: 4 mL via INTRAVENOUS

## 2019-04-13 MED ORDER — DIPHENHYDRAMINE 50 MG/ML INJECTION SOLUTION
25.0000 mg | Freq: Four times a day (QID) | INTRAMUSCULAR | Status: DC | PRN
Start: 2019-04-13 — End: 2019-04-17

## 2019-04-13 MED ORDER — BUPROPION HCL XL 150 MG 24 HR TABLET, EXTENDED RELEASE
450.0000 mg | EXTENDED_RELEASE_TABLET | Freq: Every day | ORAL | Status: DC
Start: 2019-04-13 — End: 2019-04-17
  Administered 2019-04-14 – 2019-04-17 (×4): 450 mg via ORAL
  Filled 2019-04-13 (×4): qty 3, fill #0

## 2019-04-13 MED ORDER — KETAMINE 50 MG/5 ML (10 MG/ML) IN 0.9 % SODIUM CHLORIDE IV SYRINGE
INJECTION | INTRAVENOUS | Status: DC | PRN
Start: 2019-04-13 — End: 2019-04-13
  Administered 2019-04-13: 50 mg via INTRAVENOUS

## 2019-04-13 MED ORDER — BACITRACIN 500 UNIT/GRAM TOPICAL OINTMENT
TOPICAL_OINTMENT | TOPICAL | Status: AC
Start: 2019-04-13 — End: 2019-04-13
  Filled 2019-04-13: qty 28.4, fill #0

## 2019-04-13 MED ORDER — EPHEDRINE (PF) 25 MG/5 ML (5 MG/ML) IN 0.9% SODIUM CHLORIDE IV SYRINGE
INJECTION | INTRAVENOUS | Status: DC | PRN
Start: 2019-04-13 — End: 2019-04-13
  Administered 2019-04-13 (×3): 5 mg via INTRAVENOUS

## 2019-04-13 MED ORDER — GELATIN SPONGE,ABSORBABLE-PORCINE SKIN 100 TOPICAL SPONGE
VAGINAL_SPONGE | TOPICAL | Status: AC
Start: 2019-04-13 — End: 2019-04-13
  Filled 2019-04-13: qty 1, fill #0

## 2019-04-13 MED ORDER — TRANEXAMIC ACID 1,000 MG/10 ML (100 MG/ML) INTRAVENOUS SOLUTION
INTRAVENOUS | Status: AC
Start: 2019-04-13 — End: 2019-04-13
  Filled 2019-04-13: qty 10, fill #0

## 2019-04-13 MED ORDER — INTRAOP DEXAMETHASONE 4 MG/ML INJ 2 ML SYRINGE
Status: DC | PRN
Start: 2019-04-13 — End: 2019-04-13
  Administered 2019-04-13: 10:00:00 10 mg via INTRAVENOUS

## 2019-04-13 MED ORDER — NACL 0.9% IV INFUSION
INTRAVENOUS | Status: DC
Start: 2019-04-13 — End: 2019-04-17
  Administered 2019-04-13 – 2019-04-14 (×3): via INTRAVENOUS

## 2019-04-13 MED ORDER — POLYVINYL ALCOHOL 1.4 % EYE DROPS
1.0000 [drp] | OPHTHALMIC | Status: DC | PRN
Start: 2019-04-13 — End: 2019-04-17
  Administered 2019-04-13 – 2019-04-14 (×3): 1 [drp] via OPHTHALMIC
  Filled 2019-04-13: qty 0.05, fill #0

## 2019-04-13 MED ORDER — DOCUSATE SODIUM 100 MG CAPSULE
100.0000 mg | ORAL_CAPSULE | Freq: Two times a day (BID) | ORAL | Status: DC
Start: 2019-04-13 — End: 2019-04-17
  Administered 2019-04-13 – 2019-04-16 (×6): 100 mg via ORAL
  Filled 2019-04-13 (×7): qty 1, fill #0

## 2019-04-13 MED ORDER — CHLORHEXIDINE GLUCONATE 0.12 % MOUTHWASH
15.0000 mL | MOUTHWASH | Status: AC
Start: 2019-04-13 — End: 2019-04-13
  Administered 2019-04-13: 15 mL via ORAL
  Filled 2019-04-13: qty 15, fill #0

## 2019-04-13 MED ORDER — VANCOMYCIN 1,000 MG INTRAVENOUS INJECTION
INTRAVENOUS | Status: AC
Start: 2019-04-13 — End: 2019-04-13
  Filled 2019-04-13: qty 10, fill #0

## 2019-04-13 MED ORDER — PROPOFOL 10 MG/ML INTRAVENOUS EMULSION
INTRAVENOUS | Status: AC
Start: 2019-04-13 — End: 2019-04-13
  Filled 2019-04-13: qty 100, fill #0

## 2019-04-13 MED ORDER — INTRAOP NACL 0.9% 2000 ML IRRIGATION BAG
Status: DC | PRN
Start: 2019-04-13 — End: 2019-04-13
  Administered 2019-04-13: 11:00:00 1000 mL

## 2019-04-13 MED ORDER — ROCURONIUM 50 MG/5 ML (10 MG/ML) INTRAVENOUS SYRINGE
INJECTION | INTRAVENOUS | Status: DC | PRN
Start: 2019-04-13 — End: 2019-04-13
  Administered 2019-04-13: 09:00:00 10 mg via INTRAVENOUS

## 2019-04-13 MED ORDER — PHENYLEPHRINE 1 MG/10 ML (100 MCG/ML) IN 0.9 % SOD.CHLORIDE IV SYRINGE
INJECTION | INTRAVENOUS | Status: DC | PRN
Start: 2019-04-13 — End: 2019-04-13
  Administered 2019-04-13 (×2): 80 ug via INTRAVENOUS

## 2019-04-13 MED ORDER — INTRAOP ALBUMIN, HUMAN 5% INTRAVENOUS SOLUTION
INTRAVENOUS | Status: DC | PRN
Start: 2019-04-13 — End: 2019-04-13
  Administered 2019-04-13 (×2): via INTRAVENOUS

## 2019-04-13 MED ORDER — ACETAMINOPHEN 500 MG TABLET
1000.0000 mg | ORAL_TABLET | Freq: Four times a day (QID) | ORAL | Status: DC
Start: 2019-04-14 — End: 2019-04-17
  Administered 2019-04-14 – 2019-04-17 (×11): 1000 mg via ORAL
  Filled 2019-04-13 (×12): qty 2, fill #0

## 2019-04-13 MED ORDER — LACTATED RINGERS IV INFUSION
INTRAVENOUS | Status: DC
Start: 2019-04-13 — End: 2019-04-13

## 2019-04-13 MED ORDER — THROMBIN (RECOMBINANT) 5,000 UNIT TOPICAL SOLUTION
TOPICAL | Status: DC | PRN
Start: 2019-04-13 — End: 2019-04-13
  Administered 2019-04-13: 11:00:00 via TOPICAL

## 2019-04-13 MED ORDER — INTRAOP BUPIVACAINE 0.25%-EPI 1:200,000 PF INJ 30 ML VIAL
Status: DC | PRN
Start: 2019-04-13 — End: 2019-04-13
  Administered 2019-04-13: 11:00:00
  Administered 2019-04-13: 13:00:00 20 mL

## 2019-04-13 MED ORDER — KETAMINE 50 MG/ML INJECTION SOLUTION
INTRAMUSCULAR | Status: AC
Start: 2019-04-13 — End: 2019-04-13
  Filled 2019-04-13: qty 10, fill #0

## 2019-04-13 MED ORDER — HYDROMORPHONE 1 MG/ML INJECTION SYRINGE
0.2000 mg | INJECTION | INTRAMUSCULAR | Status: DC | PRN
Start: 2019-04-13 — End: 2019-04-17
  Filled 2019-04-13 (×2): qty 1, fill #0

## 2019-04-13 MED ORDER — BISACODYL 10 MG RECTAL SUPPOSITORY
10.0000 mg | Freq: Two times a day (BID) | RECTAL | Status: DC | PRN
Start: 2019-04-13 — End: 2019-04-17

## 2019-04-13 MED ORDER — INTRAOP NACL 0.9% 1000 ML IRRIGATION BOTTLE
Status: DC | PRN
Start: 2019-04-13 — End: 2019-04-13
  Administered 2019-04-13: 13:00:00

## 2019-04-13 MED ORDER — FENTANYL (PF) 50 MCG/ML INJECTION SOLUTION
25.0000 ug | INTRAMUSCULAR | Status: DC | PRN
Start: 2019-04-13 — End: 2019-04-13
  Administered 2019-04-13 (×4): 25 ug via INTRAVENOUS
  Filled 2019-04-13: qty 2, fill #0

## 2019-04-13 MED ORDER — INTRAOP NACL 0.9% 1000 ML IRRIGATION BOTTLE
Status: DC | PRN
Start: 2019-04-13 — End: 2019-04-13
  Administered 2019-04-13: 11:00:00 1000 mL

## 2019-04-13 MED ORDER — VANCOMYCIN 1 GRAM/200 ML IN DEXTROSE 5 % INTRAVENOUS PIGGYBACK
1000.0000 mg | INJECTION | Freq: Two times a day (BID) | INTRAVENOUS | Status: AC
Start: 2019-04-13 — End: 2019-04-13
  Administered 2019-04-13: 1000 mg via INTRAVENOUS
  Filled 2019-04-13: qty 200, fill #0

## 2019-04-13 MED ORDER — MAGNESIUM HYDROXIDE 400 MG/5 ML ORAL SUSPENSION
30.0000 mL | Freq: Two times a day (BID) | ORAL | Status: DC | PRN
Start: 2019-04-13 — End: 2019-04-17
  Administered 2019-04-17: 12:00:00 30 mL via ORAL
  Filled 2019-04-13: qty 30, fill #0

## 2019-04-13 MED ORDER — ONDANSETRON HCL (PF) 4 MG/2 ML INJECTION SOLUTION
4.0000 mg | INTRAMUSCULAR | Status: AC | PRN
Start: 2019-04-13 — End: 2019-04-13
  Administered 2019-04-13: 4 mg via INTRAVENOUS
  Filled 2019-04-13: qty 2, fill #0

## 2019-04-13 MED ORDER — HYDROMORPHONE 1 MG/ML INJECTION SYRINGE
0.2000 mg | INJECTION | INTRAMUSCULAR | Status: DC | PRN
Start: 2019-04-13 — End: 2019-04-17
  Administered 2019-04-13: 0.6 mg via INTRAVENOUS
  Administered 2019-04-13: 0.5 mg via INTRAVENOUS
  Administered 2019-04-14 (×3): 0.6 mg via INTRAVENOUS
  Filled 2019-04-13 (×3): qty 1, fill #0

## 2019-04-13 MED ORDER — LACTATED RINGERS IV INFUSION
INTRAVENOUS | Status: DC
Start: 2019-04-13 — End: 2019-04-13
  Administered 2019-04-13 (×2): via INTRAVENOUS

## 2019-04-13 MED ORDER — OXYCODONE 5 MG TABLET
5.0000 mg | ORAL_TABLET | ORAL | Status: DC | PRN
Start: 2019-04-13 — End: 2019-04-17
  Administered 2019-04-14 – 2019-04-17 (×14): 15 mg via ORAL
  Filled 2019-04-13 (×14): qty 3, fill #0

## 2019-04-13 MED ORDER — CEFAZOLIN 1 GRAM SOLUTION FOR INJECTION
1.0000 g | Freq: Three times a day (TID) | INTRAMUSCULAR | Status: AC
Start: 2019-04-13 — End: 2019-04-14
  Administered 2019-04-13 – 2019-04-14 (×2): 1 g via INTRAVENOUS
  Filled 2019-04-13 (×2): qty 5, fill #0

## 2019-04-13 MED ORDER — MEPERIDINE (PF) 50 MG/ML INJECTION SYRINGE
12.5000 mg | INJECTION | INTRAMUSCULAR | Status: DC | PRN
Start: 2019-04-13 — End: 2019-04-13

## 2019-04-13 MED ORDER — ONDANSETRON HCL (PF) 4 MG/2 ML INJECTION SOLUTION
INTRAMUSCULAR | Status: DC | PRN
Start: 2019-04-13 — End: 2019-04-13
  Administered 2019-04-13: 4 mg via INTRAVENOUS

## 2019-04-13 MED ORDER — THROMBIN (RECOMBINANT) 5,000 UNIT TOPICAL SOLUTION
TOPICAL | Status: AC
Start: 2019-04-13 — End: 2019-04-13
  Filled 2019-04-13: qty 2, fill #0

## 2019-04-13 MED ORDER — INTRAOP DEXMEDETOMIDINE 100 MCG/ML IV BOLUS DOSE
Status: DC | PRN
Start: 2019-04-13 — End: 2019-04-13
  Administered 2019-04-13: 20 ug via INTRAVENOUS
  Administered 2019-04-13 (×2): 10 ug via INTRAVENOUS

## 2019-04-13 MED ORDER — ONDANSETRON 4 MG DISINTEGRATING TABLET
4.0000 mg | DISINTEGRATING_TABLET | Freq: Three times a day (TID) | ORAL | Status: DC | PRN
Start: 2019-04-13 — End: 2019-04-17

## 2019-04-13 MED ORDER — INTRAOP TRANEXAMIC ACID IVPB
Status: DC | PRN
Start: 2019-04-13 — End: 2019-04-13
  Administered 2019-04-13: 12:00:00 1000 mg via INTRAVENOUS

## 2019-04-13 SURGICAL SUPPLY — 48 items
ARCH TI 2.0 CORT SCREWS SD STAR DRIVE 4MM (401.134.99) (Screw) ×15 IMPLANT
ARCH TI 2.0 CORT SCREWS ST STAR DRIVE 4MM (401.354.99) (Screw) ×15 IMPLANT
BUR FLUTED BALL CUTTING 5MM 14 AM MIDAS REX (Bit) ×3 IMPLANT
CATHETER FOLEY 16F TEMPERATURE SENSOR WITH URIMETER KIT (Catheter) IMPLANT
CATHETER IV 14 GAUGE X 3 1/2IN JELCO (Catheter) ×3 IMPLANT
CAUTERY BOVIE PAD ADULT (Other) ×3 IMPLANT
COVER FOR OSI PROAXIS HINGE (Other) ×3 IMPLANT
COVER LIGHT HANDLE STERIS (Other) ×9 IMPLANT
DISC USE 152549 - PREP CHLORAPREP 26ML WITH TINT ~~LOC~~ (Prep) ×3 IMPLANT
DRAPE STERI 17 X 11 3M 1000 (Drape) ×12 IMPLANT
DRAPE STERI INSTUMENT POUCH 3M 1018 (Drape) ×6 IMPLANT
DRAPE TOWEL CLOTH 17 X 27IN STERILE BLUE 4 PACK (Drape) ×3 IMPLANT
DRESSING DERMABOND ADVANCED TISSUE ADHESIVE (Dressing) ×3 IMPLANT
DRILL MIDAS REX ATTACHMENT 14CM X 3.0MM MATCH HEAD CUTTER MATCHSTICK STANDARD (Bit) ×3 IMPLANT
DRILL MIDAS REX DIFFUSER MR7 FOOT PEDAL (Other) IMPLANT
DRUG SURGIFLO MATRIX WITH THROMBIN 8ML (Sealant) ×15 IMPLANT
DRUG SURGIFLO MATRIX WITH THROMBIN 8ML 2994 (Sealant) ×10 IMPLANT
ELECTRODE BLADE INSULATED 2.75IN (Blade) ×6 IMPLANT
GLOVES BIOGEL PI ULTRATOUCH M 8 TOP GLOVE LATEX FREE LIGHT GREEN PACKAGE (Glove) ×15 IMPLANT
GLOVES BIOGEL SKINSENSE  INDICATOR 8 BLUE UNDERGLOVE LATEX FREE RED PACKAGE (Glove) ×15 IMPLANT
INSTRUMENT FRAZIER SUCTION TIP 12FR (Other) ×6 IMPLANT
PACK SPINE CERVICAL (Pack) ×3 IMPLANT
PAD FOR OSI PRONEVIEW CUSHION INSERT LARGE (Other) ×3 IMPLANT
PIN DORO SKULL PIN ADULT MAYFIELD HEADREST (Pin) ×3 IMPLANT
PREP ALCOHOL PACK (Pack) ×3 IMPLANT
PREP BETADINE PAINT 4OZ (Prep) ×3 IMPLANT
PREP CLEANSING M CARE MEATAL (Other) ×3 IMPLANT
SCD SLEEVE CALF REG 18IN ALP 1 (Other) ×3 IMPLANT
SPACER MTF ODL PARALLEL 08MM 015108 (Bone) ×1 IMPLANT
SPACER MTF ODL PARALLEL 10MM 015110 (Bone) ×2 IMPLANT
SPACER MTF ODL PARALLEL 12MM 015112 (Bone) ×2 IMPLANT
SPACER SPNL ODL PARALLEL FD 10MM 015110 (Bone) ×4 IMPLANT
SPACER SPNL ODL PARALLEL FD 12MM 015112 (Bone) ×4 IMPLANT
SPACER SPNL ODL PARALLEL FD 8MM 015108 (Bone) ×2 IMPLANT
SPONGE COTTONOID 1/2 X 1/2IN (Sponge) IMPLANT
SPONGE COTTONOID 1/2 X 3IN (Sponge) ×3 IMPLANT
SPONGE GAUZE 4 X 4IN 12PLY STERILE 10 PACK (Dressing) ×3 IMPLANT
SPONGE PEANUT 3/8IN X RAY DETECTABLE WITH FOAM COUNT HOLDER 5 PER PACK (Sponge) ×3 IMPLANT
SUTURE ETHIBOND 0 CT-1 18IN CONTROL RELEASE 8 PACK GREEN (Suture) ×6 IMPLANT
SUTURE MONOCRYL 3-0 PS-1 27IN UNDYED (Suture) ×3 IMPLANT
SUTURE POLYSORB 2-0 V-20 18IN POP OFF 5 PACK UNDYED (Suture) ×15 IMPLANT
SUTURE VICRYL 0 CT-1 18IN CONTROL RELEASE 8 PACK UNDYED (Suture) ×12 IMPLANT
SYNTHES PLATE ARCH 2.0 MINI DBL BEND 08 X 31MM 443.178 (Plate) ×3 IMPLANT
SYNTHES PLATE ARCH 2.0 MINI DBL BEND 10 X 33MM 443.180 (Plate) ×6 IMPLANT
SYNTHES PLATE ARCH 2.0 MINI DBL BEND 12 X 35MM 443.182 (Plate) ×6 IMPLANT
SYNTHES SCREW CORTEX SELF TPNG 2 X 6MM 401.356.99 (Screw) ×21 IMPLANT
TAPE CLOTH 3IN X 10YDS (Other) ×3 IMPLANT
TUBING TURP IRRIGATING Y CONNECTOR CYSTO IRRIGATION (Tubing) IMPLANT

## 2019-04-13 NOTE — Anesthesia Postprocedure Evaluation (Addendum)
Patient: Rhonda Horton    LAMINOPLASTY, SPINE, CERVICAL, POSTERIOR APPROACH    Anesthesia Type: general    Stop Bang: 5    Vital Signs (Last Recorded):  BP: 143/84 (04/13/19 1500)  Pulse: 78 (04/13/19 1500)  Resp: 21 (04/13/19 1500)  Temp Max: 37.1 C (98.8 F)  (Last 24 hours)  Temp: 36.8 C (98.2 F) (04/13/19 1500)  SpO2: 99 % (04/13/19 1500) on    Device (Oxygen Therapy): nasal cannula (04/13/19 1450)      Anesthesia Post Evaluation    Procedure: Procedure(s):LAMINOPLASTY, SPINE, CERVICAL, POSTERIOR APPROACH  [facility]  Anesthesia: General    Patient location during evaluation: PACU  Patient participation: complete - patient participated  Level of consciousness: sleepy but conscious  Pain score: 3  Pain management: adequate  Multimodal analgesia pain management approach  Airway patency: patent  Two or more strategies used to mitigate risk of obstructive sleep apnea  Anesthetic complications: no  Cardiovascular status: blood pressure returned to baseline and stable (BP starting to rise d/t not taking antihypertensives this AM)  Respiratory status: nasal cannula (2 L NC)  Hydration status: euvolemic  Nausea and Vomiting: absent     Reyne Dumas, MD      The patient was seen by me in PACU and I agreed with the above assessment as well as the plan for discharge from PACU status.  Electronically signed by:  Kinnie Scales. Sheppard Penton, M.D.  Attending Anesthesiologist  P.I. [phone]  Pager [phone]

## 2019-04-13 NOTE — Nurse Focus (Signed)
PREOP ADMIT NURSING NOTE    Note Started: 04/13/2019, 08:39     Received patient from T6 at 0820 hours via bed.  Patient status  awake and alert.  Oriented to room and unit.  Admission assessment and care plan initiated.   PIN number cards provided to patient.   Mikey Kirschner, RN

## 2019-04-13 NOTE — Anesthesia Procedure Notes (Signed)
Airway  Date/Time: 04/13/2019 9:21 AM    Staff    Patient location:  [facility] OR          Indications and Patient Condition    Spontaneous Ventilation: absent  Sedation level: level 0: deep/analgesia  Preoxygenated: yes  Patient position: reverse Trendelenburg  MILS not maintained throughout  Mask difficulty assessment: 0 - not attempted    Final Airway Details    Final airway type: endotracheal airway        Successful intubation technique: video laryngoscopy  Facilitating devices/methods: intubating stylet  Endotracheal tube insertion site: oral  Blade: Macintosh  Blade size: #3  ETT size: 7.5 mm  Cormack-Lehane Classification: grade I - full view of glottis  Placement verified by: chest auscultation and capnometry   Measured from: teeth and gums  Secured at 22 cm.

## 2019-04-13 NOTE — Nurse Transfer Note (Signed)
PACU Transfer Note, [facility] Transport  Note started on 04/13/2019 , 15:35    Patient transferred to t6 unit  via bed. Escorted by [facility] at 1535 hours. Assessment unchanged.  Belongings with not applicable. Report in EMR and RN notified patient in route. Donnelly Angelica, RN

## 2019-04-13 NOTE — Nurse Transfer Note (Signed)
TRANSFER NOTE - RECEIVING    Note Started: 04/13/2019, 15:50     Patient received at 1545 hours from PACU unit by bed. Pt condition stable . patient oriented to room and unit. MD notified of patient's arrival on unit.  Plan of care reviewed and updated. Alvie Heidelberg, RN RN

## 2019-04-13 NOTE — Op Note (Signed)
OPERATIVE NOTE  Service Orthopaedic Spine    Date of Service:   04/13/2019    Pre-Op Diagnosis:     1) Cervical Myelopathy  2) Cervical Stenosis    Post-Op Diagnosis:   Same    Procedure Performed/Description:     1) Laminoplasty C3-C7  2) Foraminotomy Left C5, C6, C7    Instrumentation Used:   DePuy-Synthes    Attending Surgeon:     Quincy Sheehan, MD    Resident Surgeon [phone] Assist]:   Roxanna Mew, MD    Back-Up Attending Surgeon:     Mila Homer, MD    Level of Involvement of Attending Physician:    Scrubbed for the Entire Procedure    DVT Prophylaxis:   TEDs, SCDs     Operative Details:   The patient was identified in Preoperative Holding. Consent and laterality were verified. Risks, benefits, and  alternatives of surgery were discussed with the patient and those present, and they accepted and desired to proceed. All questions were answered  and no guarantees were given or implied. They voiced an understanding of the surgical plan and postoperative care.     The patient was then brought into the Operating Theater where anesthesia was induced. Line access was performed, the patient was positioned prone on a flat operative table. Care was taken to pad all bony prominences from the head, upper extremities, trunk and lower extremities. They were then prepped and draped in the standard surgical fashion. A time-out was then performed with the attending physicians present in both Anesthesia and Surgery, as well as nursing staff members. A consensus was reached and our procedure began. Neuromonitoring, including MEPs and SSEPs were set up, determined to be stable after positioning, and followed for the duration of our procedure.     A cosmetic, linear incision was made centered over the spinous processes. Sharp and blunt dissection was carried out down to the deep fascia. The incision was centered over the area of operative interest, from C3-T1. We then began to dissect the spine in a subperiosteal fashion from each  spinous process down the lamina, to the medial border of the lateral masses. Localizing intraoperative xrays were then used to determine the operative levels. Care was taken to not violate the cranial and caudal facet joint capsules, or interspinous ligaments. Hemostasis was achieved throughout  with bovie, bipolar electrocautery, injectable gelfoam, and gelfoam/thrombin.      We then performed our bony troughs on each side of the lamina. The patient's symptoms were left-sided, our completed laminar osteotomy was therefore performed on the left-side while the incomplete greenstick was performed on the right-sided. A matchstick burr was utilized to make our bony trough on the left-side. A 6 mm diamond burr was utilized to make our incomplete osteotomy on the right-side. We then completed the osteotomy with a Kerrison Rongeur. A greenstick, incomplete osteotomy was performed. We then used DePuy-Synthes MTF laminoplasty allograft plate constructs at C3-C7 of appropriate size. They were secured with a single screw into both the lateral mass and lamina at each level, appropriately sized. A partial T1 dome laminectomy was performed on the cranial aspect to allow for decompression.     Copious irrigation was then undertaken at this point in time including betadine saline solution and normal saline rinse. A drain was then placed. The wound was then closed in layers. Deep fascial layer was with an interrupted 0 Vicryl suture. The dead space was closed down with 0 Vicryl followed by 2-0 inverted Vicryl  dermal stitches and a running subcuticular monocryl was used for skin closure. Soft dressings were then applied and they were safely awoken from anesthesia without complication.     Post-Operative Plan:    IV Decadron 10 mg Q6H  MAP Therapy 70-80  24 Hours Post-Op ABX  Mobilization with PT/OT when Able  Upright Cervical XR    Specimens Removed:    Expected Bone and Soft Tissue    EBL:  See Anesthesia Record    Drains:  Deep  Fascial Hemovac Drain    Complications:    None    Outcome:   Stable to PACU, Eventual Transfer to General Surgical Floor

## 2019-04-13 NOTE — Progress Notes (Signed)
Orthopaedic Spine Progress Note  Date: 04/13/2019  Unit: Data Unavailable  Attending of Record: Attending Provider: Mila HomerJavidan, Yashar, MD    Procedure:  5/4: C3-7 laminopasty [name](YJ)    Subjective:  Patient groggy in PACU, difficult to arouse an hour and a half after surgery    Objective:  Temp src: Oral (05/04 1551)  Temp:  [36.5 C (97.7 F)-37.1 C (98.8 F)]   Pulse:  [66-87]   BP: (118-155)/(55-88)   Resp:  [12-21]   SpO2:  [94 %-100 %]     Intake/Output Summary (Last 24 hours) at 04/13/2019 1823  Last data filed at 04/13/2019 1800  Gross per 24 hour   Intake 3670 ml   Output 1310 ml   Net 2360 ml          General: NAD, mentating well. Answering questions appropriately  Breathing comfortably  Regular rate peripheral pulses  Abdomen nondistended, soft  Neck: supple  Incision CDI    Be advised the patient is 4/5 in all categories given she was still sedated and difficult to examine. More appropriate examination will be taken tomorrow morning.     Bilateral upper extremity:  Strength: upper extremities   Movement Shoulder abduction  elbow flexion  Elbow flexion   wrist extension  Elbow extension   wrist flexion   Finger extension  Finger flexion,  finger adduction Finger intrinsics    Root C5 C6 C7 C8 T1   Nerve axillary, suprascapular,  musculocutaneous radial,  musculocutaneous median,  radial median, ulnar median, ulnar   Muscle deltoid, biceps biceps radialis extensor carpi   longus/ brevis triceps,   flexor carpi   radialis/ ulnaris FDP  radialis/ulnaris/  superficialis interossei   dorsal/palmar   Right 4/5 4/5 4/5 4/5 4/5   Left 4/5 4/5 4/5 4/5 4/5     Vascular: palpable radial pulse, fingers warm and well perfused, capillary refill < 2s  Sensory:   Right: intact to light touch in C5-T1 dermatomes  Left: intact to light touch in C5-T1 dermatomes    Bilateral lower extremity:  Strength: Lower Extremities -   Movement Hip   flexion Knee  flexion Knee   extension  Ankle   dorsiflexion Ankle  plantar  flexion   Big toe   extension    Root L1/ 2/ 3 L5/ S1/ S2 L2/ 3/ 4 L4/ 5 S1/ 2 L5/ S1   Nerve femoral/   spinal n. sciatic   femoral deep   peroneal tibial deep   peroneal   Muscle iliopsoas hamstrings quads tibialis  anterior gastrocnemius ,  soleus EHL   Right 4/5 4/5 4/5 4/5 4/5 4/5   Left 4/5 4/5 4/5 4/5 4/5 4/5     Vascular: palpable dorsalis pedis pulse, toes warm and well perfused, capillary refill < 2s  Sensory:   Right: intact to light touch in L1-S1 dermatomes  Left: intact to light touch in L1-S1 dermatomes     Laboratory Tests:  Lab Results   Lab Name Value Date/Time    WBC 17.3 (H) 04/13/2019 04:38 PM    HGB 11.3 (L) 04/13/2019 04:38 PM    HCT 32.7 (L) 04/13/2019 04:38 PM    PLT 264 04/13/2019 04:38 PM     Lab Results   Lab Name Value Date/Time    NA 141 04/13/2019 06:21 AM    K 4.0 04/13/2019 06:21 AM    CL 107 04/13/2019 06:21 AM    CO2 25 04/13/2019 06:21 AM    BUN 17 04/13/2019  06:21 AM    CR 0.93 04/13/2019 06:21 AM    GLU 113 (H) 04/13/2019 06:21 AM     INR   Recent labs for the past 24 hours     04/12/19 2040    INR 1.05     PTT   No components found with this basename: IPTT:*    Assesment and Plan:  Rhonda Horton is a 75yr female now POD #         # PT/OT- early ROM and mobility  # Pain control: spine pain protocol  # DVT prophylaxis: SCDs, Lovenox on POD 2  # Weight bearing status:  As tolerated with collar  # Antibiotics: 24 hours post op completed  # Diet: clears and advance to regular  # Imaging: post op images pending  # Foley to be removed POD1 in AM  # Drains: DC when less than 30cc output  # DC planning for Home health and PT  # Requires in patient admission for pain control, PT needs, and drain management.     -------------------------------    Lovie Chol. Loleta Chance, MD PGY3  Orthopaedic Spine Surgery  Personal Pager: [phone]  PI# [id number]

## 2019-04-13 NOTE — Brief Op Note (Signed)
FELLOW BRIEF OPERATIVE NOTE  Service - Orthopaedic Spine      Pre-Op Diagnosis:  Cervical Myelopathy     Post-Op Diagnosis:  Same     Procedure Performed/Description:  C3-C7 Laminoplasty, Partial T1 Dome Laminectomy     Name of Surgeon and Assistants:  Benna Dunks [Attending], Hill [Resident], Javidan [Back-Up]     Type of Anesthesia:  General     Wound Classification: Clean     DVT Prophylaxis:  TED, SCDs     Findings:  Notable Cervical Stenosis     Specimens Removed:  Expected Bone and Soft Tissue     EBL:  400 mL     Drains:  Deep Fascial Hemovac Drain     Fluids:  Crystalloid     Complications:  None     Outcome: Stable in PACU, Transfer to General Surgical Floor     Quincy Sheehan, MD  Spine Fellow, Pager# [phone]
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
## 2019-04-13 NOTE — Nurse Assessment (Signed)
ASSESSMENT NOTE    Note Started: 04/13/2019, 19:18     Initial assessment completed and recorded in EMR.  Report received from day shift nurse and orders reviewed. Plan of Care reviewed and updated, discussed with patient.  Alexi Geibel de Lawson Radar, RN RN

## 2019-04-13 NOTE — Allied Health Consult (Signed)
Respiratory Care Evaluation - Pulmonary Hygiene Pathway Initial Evaluation    Name: Rhonda Horton Admit Date:   04/11/2019  8:57 PM Date of Birth:   18-May-1956   Age: 63 yrs Sex: female        Chief Complaint:   No chief complaint on file.      Risk Factor Analysis:    *If any part of the risk factor analysis cannot be filled out, do not apply the tool: assess/tx as appropriate. Physician may order a new evaluation via the risk factor analysis at any time.    Score with Spirometry  0 - 4 points = low risk: no atelectasis; no retained secretions  5 - 9 points = moderate risk  10 - 17 points = high risk  18 - 24 points = acute risk    Score without Spirometry  0 - 3 points = low risk: no atelectasis; no retained secretions  4 - 7 points = moderate risk  8 - 14 points = high risk  15 - 20 points = acute risk    Spirometry:  No spirometry on file  Age:  <65 = 0  Obesity:  <150% IBW = 0  Surgery Location:  None = 0  Pulmonary Hx:  None = 0  Incentive Spirometry:  > 78ml/kg IBW = 0  Atelectasis/consolidation on CXR:  no atelectasis/consolidation/cxray = 0  Smoking Hx:  quit >= 1 yr ago = 1  Mobility Issues:   Limited mobility = 1    Risk Factor Total Score: 2  Risk Level: Low Risk          Treatment Plan by RT:   Modality: No Intervention indicated per Pulmonary Hygiene Pathway, see Treatment Plan by RN below  Frequency: None    Treatment Plan by RN:  4x/day deep breathing, I.S., cough/splint, ambulate/chair per pathway.     *Moderate through acute risk patients will be re-evaluated every 24 hours using the Progress Scoring Tool.    If patient's condition changes please reorder Respiratory Care Evaluation.    Robley Matassa, RT

## 2019-04-13 NOTE — Anesthesia Preprocedure Evaluation (Addendum)
Anesthesia Evaluation    History of Present Illness  63 yo female for posterior cervical spine laminoplasty     Progressive weakness and difficulty walking over past week.  Airway   Mallampati: II  TM distance: >3 FB          Dental    (+) missing and upper dentures/partial       Pulmonary   (-) shortness of breath    Patient's breath sounds clear to auscultation. Cardiovascular   (+) hypertension (well controlled),  hypertriglyceridemia,     Rhythm: regular  Rate: normal   (Able to go grocery shopping and unload groceries into home, no cp or sob)   Neuro/Psych    (+) chronic back pain,   Patient has psychiatric history.   GI/Hepatic/Renal   (+) GERD (well controlled),         Abdominal   (+) obese   Endo/Other    (+) arthritis    Comments: hypothyroid  Smoking History    (-) current smoker  Smokes 1 pack of cigarettes per day  Date quit smoking 01/13/2019         I have reviewed the patient's Nursing notes.                 Anesthesia Plan    ASA 3     general     Intravenous induction    Anesthetic plan and risks discussed with patient.  Resident/Fellow/CRNA discussed the plan with the attending.  I personally performed a physical assessment on this patient.

## 2019-04-13 NOTE — Nurse Focus (Signed)
Patient off floor to prep. Belongings kept here in room, including glasses, cell phone and charger, and dentures and earrings. Patient refused to take out nose ring - said "they can tape it down there". VSS.

## 2019-04-13 NOTE — OR Nursing (Signed)
OR Arrive    Patient's level of consciousness: awake and comfortable    Summary report reviewed: yes    Patient transported to OR via: bed  Transported by: anesthesia person and circulator  Head of bed: elevated  Oxygen delivered via: N/A  Monitored enroute: no  Assumed care.

## 2019-04-13 NOTE — Plan of Care (Signed)
Problem: Patient Care Overview  Goal: Plan of Care Review  Outcome: Ongoing (interventions implemented as appropriate)  Goal: Individualization and Mutuality  Outcome: Ongoing (interventions implemented as appropriate)  Goal: Discharge Needs Assessment  Outcome: Ongoing (interventions implemented as appropriate)  Goal: Interprofessional Rounds/Family Conf  Outcome: Ongoing (interventions implemented as appropriate)     Problem: Pain, Acute (Adult)  Goal: Identify Related Risk Factors and Signs and Symptoms  Description: Related risk factors and signs and symptoms are identified upon initiation of Human Response Clinical Practice Guideline (CPG).  Outcome: Ongoing (interventions implemented as appropriate)  Goal: Acceptable Pain Control/Comfort Level  Description: Patient will demonstrate the desired outcomes by discharge/transition of care.  Outcome: Ongoing (interventions implemented as appropriate)     Problem: Mobility, Physical Impaired (Adult)  Goal: Identify Related Risk Factors and Signs and Symptoms  Description: Related risk factors and signs and symptoms are identified upon initiation of Human Response Clinical Practice Guideline (CPG).  Outcome: Ongoing (interventions implemented as appropriate)  Goal: Enhanced Mobility Skills  Description: Patient will demonstrate the desired outcomes by discharge/transition of care.  Outcome: Ongoing (interventions implemented as appropriate)  Goal: Enhanced Functional Ability  Description: Patient will demonstrate the desired outcomes by discharge/transition of care.  Outcome: Ongoing (interventions implemented as appropriate)     Problem: Fall Risk (Adult)  Goal: Identify Related Risk Factors and Signs and Symptoms  Description: Related risk factors and signs and symptoms are identified upon initiation of Human Response Clinical Practice Guideline (CPG).  Outcome: Ongoing (interventions implemented as appropriate)  Goal: Absence of Fall  Description: Patient will  demonstrate the desired outcomes by discharge/transition of care.  Outcome: Ongoing (interventions implemented as appropriate)

## 2019-04-13 NOTE — Nurse Assessment (Signed)
PACU ADMIT NURSING NOTE    Note Started: 04/13/2019, 13:48     Received patient from OR at 1344 hours via bed.  Monitor and Alarms on.  Patient sleepy but arousable. Moses Manners, RN

## 2019-04-13 NOTE — Plan of Care (Signed)
Problem: Patient Care Overview  Goal: Plan of Care Review  Outcome: Ongoing (interventions implemented as appropriate)  Flowsheets (Taken 04/13/2019 0210)  Plan of Care Reviewed With: patient  Progress: no change  Outcome Summary: Pt was npo post MN for possible ortho surgery today.Up to [facility] with assist.Melatonin given for sleep,able to sleep @ long intervals,nad,vss.Safety measures maintained.Needs attended.Will continue to monitor.  Goal: Individualization and Mutuality  Outcome: Ongoing (interventions implemented as appropriate)  Goal: Discharge Needs Assessment  Outcome: Ongoing (interventions implemented as appropriate)  Goal: Interprofessional Rounds/Family Conf  Outcome: Ongoing (interventions implemented as appropriate)     Problem: Pain, Acute (Adult)  Goal: Identify Related Risk Factors and Signs and Symptoms  Description: Related risk factors and signs and symptoms are identified upon initiation of Human Response Clinical Practice Guideline (CPG).  Outcome: Ongoing (interventions implemented as appropriate)  Goal: Acceptable Pain Control/Comfort Level  Description: Patient will demonstrate the desired outcomes by discharge/transition of care.  Outcome: Ongoing (interventions implemented as appropriate)     Problem: Mobility, Physical Impaired (Adult)  Goal: Identify Related Risk Factors and Signs and Symptoms  Description: Related risk factors and signs and symptoms are identified upon initiation of Human Response Clinical Practice Guideline (CPG).  Outcome: Ongoing (interventions implemented as appropriate)  Goal: Enhanced Mobility Skills  Description: Patient will demonstrate the desired outcomes by discharge/transition of care.  Outcome: Ongoing (interventions implemented as appropriate)  Goal: Enhanced Functional Ability  Description: Patient will demonstrate the desired outcomes by discharge/transition of care.  Outcome: Ongoing (interventions implemented as appropriate)

## 2019-04-13 NOTE — OR Nursing (Signed)
OR To Postop Destination    Patient's level of consciousness: awake and comfortable    Patient transferred to: PACU  Transported via: bed  Transported by: anesthesia person and circulator  Head of bed: elevated  Oxygen delivered via: face mask  Monitored enroute: no    Report given to: PACU RN

## 2019-04-13 NOTE — Progress Notes (Addendum)
NEUROLOGY PROGRESS NOTE  Date: 04/13/2019, 07:54 Date of Admission: 04/11/2019   Hospital Day:  LOS: 2 days        ID:                              INTERVAL   - No acute overnight events   - NPO     SUBJECTIVE   - pt seen post-op, doing well    MEDICATIONS   Scheduled Medications  Amlodipine (NORVASC) Tablet 5 mg, ORAL, QAM  Atorvastatin (LIPITOR) Tablet 20 mg, ORAL, Daily Bedtime  BuPROPion (WELLBUTRIN XL) XL 24hr Tablet 300 mg, ORAL, QAM  CeFAZolin (KEFZOL, ANCEF) 2 g in Dextrose (iso-osm) 100 mL IVPB, IV, ON-CALL OR  Docusate (COLACE) Capsule 100 mg, ORAL, BID  Escitalopram (LEXAPRO) Tablet 40 mg, ORAL, QAM  Gabapentin (NEURONTIN) Capsule 100 mg, ORAL, TID  [Held by provider] Heparin 5000 units/mL Injection 5,000 Units, SUBCUTANEOUS, Q8H (20,10,07)  Hydrochlorothiazide (HYDRO-DIURIL) Tablet 12.5 mg, ORAL, QAM  LevoTHYROxine Tablet 137 mcg, ORAL, QAM AC  Metoprolol Tartrate (LOPRESSOR) Tablet 50 mg, ORAL, BID  Pantoprazole (PROTONIX) Delayed Release Tablet 40 mg, ORAL, QAM AC  Vancomycin (VANCOCIN) 1,500 mg in NaCl 0.9% 550 mL Vial-Mate IVPB, IV, ON-CALL OR        IV Medications       PRN Medications  Acetaminophen (TYLENOL) Tablet 650 mg, ORAL, Q4H PRN  HydrALAZINE (APRESOLINE) Injection 5 mg, IV, Q6H PRN  Magnesium Hydroxide (MILK OF MAGNESIA) 400 mg/5 mL Suspension 30 mL, ORAL, Q12H PRN  Melatonin Tablet 3 mg, ORAL, Bedtime PRN  Metoclopramide (REGLAN) Injection 10 mg, IV, Q6H PRN  Ondansetron (ZOFRAN) Injection 4 mg, IV, Q12H PRN  Polyethylene Glycol 3350 (MIRALAX) Oral Powder Packet 17 g, ORAL, BID prn        OBJECTIVE:     Vital Signs  Summary  Temp Min: 36.8 C (98.2 F) Max: 37.3 C (99.1 F)  BP: (118-142)/(55-93)   Pulse Min: 66 Max: 80  Resp Min: 18 Max: 18  SpO2 Min: 96 % Max: 98 %      Current Vitals  Temp: 36.8 C (98.2 F)  BP: 140/70  Pulse: 66  Resp: 18  SpO2: 97 %      Weight: 97.4 kg (214 lb 12.8 oz)     Intake and Output  05/03 0700 - 05/04 0659  In: 1000   Out: 300     General  Exam  General: Pleasant, interactive and in no acute distress.  Pulmonary: No apparent increased work of breathing on room air. No audible external wheeze.   GI: No visible abdominal distension.   Skin: no visible rash or lesions over the face, arm, or legs.     Neurologic Exam:  Mental Status: Awake, alert, and able to easily follow conversation and respond appropriately. Able to follow simple 1 step commands. Intact verbal comprehension, expression, and fluency.   Cranial Nerves: EOMI grossly and without nystagmus. No ptosis. Face symmetric at rest and with movements. Hearing intact to voice. Speech articulation is clear and without dysarthria.   Motor: moves all extremities.   Coordination: no ataxia or dysmetria when reaching for objects.      LAB TESTS/STUDIES   Labs reviewed and notable for:  Lab Results - 24 hours (excluding micro and POC)   CBC NO DIFFERENTIAL     Status: Normal   Result Value Status    White Blood Cell Count 6.4 Final  Red Blood Cell Count 4.26 Final    Hemoglobin 13.9 Final    Hematocrit 41.1 Final    MCV 96.4 Final    MCH 32.6 Final    MCHC 33.8 Final    RDW 13.3 Final    MPV 9.1 Final    Platelet Count 289 Final   COMPREHENSIVE METABOLIC PANEL     Status: Abnormal   Result Value Status    Sodium 140 Final    Potassium 3.6 Final    Chloride 106 Final    Carbon Dioxide Total 23 (L) Final    Urea Nitrogen, Blood (BUN) 18 Final    Creatinine Serum 0.97 Final    Glucose 174 (H) Final    Calcium 9.8 Final    Protein 6.6 Final    Albumin 3.8 Final    Alkaline Phosphatase (ALP) 69 Final    Aspartate Transaminase (AST) 24 Final    Bilirubin Total 0.4 Final    Alanine Transferase (ALT) 32 Final    E-GFR, African American (Female) 84 Final    E-GFR, Non-African American (Female) 72 Final   CREATINE KINASE     Status: Normal   Result Value Status    Creatine Kinase 35 Final   URINALYSIS AND CULTURE IF IND     Status: Abnormal   Result Value Status    COLLECTION CLEAN CATCH Final    COLOR Yellow  Final    CLARITY Sl Turbid Final    SPECIFIC GRAVITY, URINE 1.023 Final    pH URINE 5.0 Final    OCCULT BLOOD URINE Negative Final    BILIRUBIN URINE Negative Final    KETONES Negative Final    GLUCOSE URINE Negative Final    PROTEIN URINE Negative Final    UROBILINOGEN 4.0 (Abnl) Final    NITRITE URINE Negative Final    LEUK ESTERASE Negative Final    MICROSCOPIC  Indicated Final    WBC, URINE 2 Final    RBC 3 Final    BACTERIA/HPF Few Final    SQUAMOUS EPI 3 Final    MUCOUS/LPF Few Final    HYALINE CASTS 2 Final    CALCIUM OXALATE Moderate (Abnl) Final    URINE CULTURE NOT INDICATED Final   TSH WITH FREE T4 REFLEX     Status: Normal   Result Value Status    Thyroid Stimulating Hormone 1.14 Final   UR DRUGS OF ABUSE SCREEN     Status: Abnormal   Result Value Status    Barbiturates Screen, Urine NEGATIVE Final    Benzodiazepines Screen, Urine POSITIVE (Abnl) Final    Cocaine Metabolite Scrn, Urine NEGATIVE Final    Opiates Screen, Urine POSITIVE (Abnl) Final    Amphetamine Screen, Urine NEGATIVE Final   INR     Status: Normal   Result Value Status    Prothrombin Time 9.6 Final    INR 1.05 Final   APTT STUDIES     Status: Normal   Result Value Status    aPTT 30.8 Final   CBC NO DIFFERENTIAL     Status: Normal   Result Value Status    White Blood Cell Count 9.2 Final    Red Blood Cell Count 4.17 Final    Hemoglobin 13.7 Final    Hematocrit 40.0 Final    MCV 96.1 Final    MCH 32.8 Final    MCHC 34.1 Final    RDW 13.3 Final    MPV 9.3 Final    Platelet Count 249 Final  MAGNESIUM (MG)     Status: Normal   Result Value Status    Magnesium (Mg) 2.1 Final   PHOSPHORUS (PO4)     Status: Normal   Result Value Status    Phosphorus (PO4) 3.4 Final   BASIC METABOLIC PANEL     Status: Abnormal   Result Value Status    Sodium 141 Final    Potassium 4.0 Final    Chloride 107 Final    Carbon Dioxide Total 25 Final    Urea Nitrogen, Blood (BUN) 17 Final    Creatinine Serum 0.93 Final    Glucose 113 (H) Final    Calcium 9.6 Final     E-GFR, African American (Female) 1877 Final    E-GFR, Non-African American (Female) 4767 Final       Micro:  RADIOGRAPHIC STUDIES        ASSESSMENT and PLAN   This is a [age]yr-old R-handed female with PMH of chronic LBP, s/p 2 lumbar surgeries and 1 cervical spine surgery, HTN, dyslipidemia who presented on 04/11/2019 as a transfer for paraparesis of unknown etiology. She reported 4 days of worsening weakness in bilateral lower extremities, symmetric.    Differential included structural injury (herniation of disc or other intramedullary lesion causing mass-effect on spinal cord), exacerbation of existing weakness due to emotional stress or other CNS process.  Patient does have known history of chronic low back pain, though prior imaging is not available.  Has not had any recent trauma, but is certainly possible that she could have worsening of stenosis or other structural problem.  Received MRI at outside hospital, which was read as normal. [facility] further work-up of this.    On exam, she was noted to have clonus bilaterally, L>R and symmetric hyperreflexia out of proportion throughout. Imaging was performed, including MRI C/T-spine. Orthopedics spine team was consulted, taken for surgery 5/4.    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 morning    =====================================================================    No further acute recommendations  from our neurology team. Please do not hesitate to call us back if further questions arise. Patient staffed with Dr. Romie Leveeicardo Ilanna Deihl.  Additional recommendations may follow in an attending addendum.      Electronically signed by:   Josetta HuddleHemali Patel, PGY-2  Department of Neurology  PI #: [id number]  Pager: [phone]  Neurology Consult Pager: [phone]  Pediatric Neurology Consult Pager: [phone]  Date: 04/13/2019    I was not able to see the patient as she was in surgery at the time of rounds. However, I discuss the case with the resident and  I agree with the assessments and plan as outlined above.  Jaquilla Woodroof A. Andi HenceMaselli, M.D.  Neurology Attending

## 2019-04-13 NOTE — Nurse Assessment (Signed)
ASSESSMENT NOTE    Note Started: 04/13/2019, 07:50     Initial assessment completed and recorded in EMR.  Report received from night shift nurse and orders reviewed. Plan of Care reviewed and updated and appropriate, discussed with patient.  Alvie Heidelberg, RN RN

## 2019-04-13 NOTE — Allied Health Progress (Signed)
Orthotics/Prosthetics  Progress Note    Progress Note: [name] was seen 04/13/2019 for a Cervical Orthosis (CO).  Patient has an active Doctors referral/ verbal order to be evaluated and treated for this device.  It has been determined by the referring physician that this device is medically necessary, can functionally benefit the patient, and is required stabilization for medical reasons.     Rhonda Horton is a 63yr old female. Her height is N/A, and weight is N/A    Pain Status (out of 10): Comments Unavailable.    Patient's Complaints: N/A    Current Device(s): None    Service Provided: Patient provided with a CO to the [facility] as requested.  Delivered an [employer] collar to OR 22 for patient.  Orthosis to be donned postsurgically by surgeons.    [facility]:  L0172: Cervical, collar, semi-rigid thermoplastic foam, two-piece, prefabricated, OTS    Patient able to ambulate safely with device(s)?: Not Assessed    Follow up: as needed prior to discharge.    Dx:     Patient Active Problem List   Diagnosis    Abnormality of gait           Marquette Saa, CO # 2773  Senior Orthotist, [place] [address]  CCS Paneled Orthotist  Department of PM&R Therapies  Acute care and rehab orthotics  Vocera [phone]

## 2019-04-13 NOTE — Nurse Focus (Addendum)
Page Sent      PAGER ID: [phone]   MESSAGE: MRN [id number] - J.M. - pt feels like there an abrasion on her Right eye after surgery. I tried to wash her eyes etc but it didn't help and it is red and more swollen that her left. can you come assess? thanks [id number] Rhonda Horton    1826: Spoke to Ortho PM MD, they will come to take a look at patient in a little bit and will order eye drops.

## 2019-04-14 LAB — CBC NO DIFFERENTIAL
Hematocrit: 32.2 % — ABNORMAL LOW (ref 36.0–46.0)
Hemoglobin: 11 g/dL — ABNORMAL LOW (ref 12.0–16.0)
MCH: 32.9 pg (ref 27.0–33.0)
MCHC: 34.3 % (ref 32.0–36.0)
MCV: 95.9 fL (ref 80.0–100.0)
MPV: 9.1 fL (ref 6.8–10.0)
Platelet Count: 257 10*3/uL (ref 130–400)
RDW: 13.2 % (ref 0.0–14.7)
Red Blood Cell Count: 3.36 10*6/uL — ABNORMAL LOW (ref 4.00–5.20)
White Blood Cell Count: 18.3 10*3/uL — ABNORMAL HIGH (ref 4.5–11.0)

## 2019-04-14 LAB — BASIC METABOLIC PANEL
Calcium: 8.9 mg/dL (ref 8.6–10.5)
Carbon Dioxide Total: 23 mmol/L — ABNORMAL LOW (ref 24–32)
Chloride: 109 mmol/L (ref 95–110)
Creatinine Serum: 0.69 mg/dL (ref 0.44–1.27)
E-GFR Creatinine (Female): 95 mL/min/{1.73_m2}
E-GFR, African American (Female): >100 mL/min/{1.73_m2}
Glucose: 149 mg/dL — ABNORMAL HIGH (ref 70–99)
Potassium: 4.2 mmol/L (ref 3.3–5.0)
Sodium: 140 mmol/L (ref 135–145)
Urea Nitrogen, Blood (BUN): 9 mg/dL (ref 8–22)

## 2019-04-14 LAB — MAGNESIUM (MG): Magnesium (Mg): 1.9 mg/dL (ref 1.5–2.6)

## 2019-04-14 LAB — PHOSPHORUS (PO4): Phosphorus (PO4): 2.5 mg/dL (ref 2.4–5.0)

## 2019-04-14 MED ORDER — REMOVE NICOTINE PATCH
1.0000 | Status: DC
Start: 2019-04-15 — End: 2019-04-17
  Administered 2019-04-15 – 2019-04-17 (×3): 1 via TRANSDERMAL

## 2019-04-14 MED ORDER — ERYTHROMYCIN 5 MG/GRAM (0.5 %) EYE OINTMENT
0.5000 [in_us] | TOPICAL_OINTMENT | Freq: Four times a day (QID) | OPHTHALMIC | Status: DC
Start: 2019-04-14 — End: 2019-04-17
  Administered 2019-04-14 – 2019-04-16 (×8): 0.5 [in_us] via OPHTHALMIC
  Filled 2019-04-14: qty 1, fill #0

## 2019-04-14 MED ORDER — GABAPENTIN 400 MG CAPSULE
400.0000 mg | ORAL_CAPSULE | Freq: Three times a day (TID) | ORAL | Status: DC
Start: 2019-04-14 — End: 2019-04-17
  Administered 2019-04-14 – 2019-04-17 (×8): 400 mg via ORAL
  Filled 2019-04-14 (×8): qty 1, fill #0

## 2019-04-14 MED ORDER — NICOTINE 7 MG/24 HR DAILY TRANSDERMAL PATCH
1.0000 | MEDICATED_PATCH | TRANSDERMAL | Status: DC
Start: 2019-04-14 — End: 2019-04-17
  Administered 2019-04-14 – 2019-04-17 (×4): 1 via TRANSDERMAL
  Filled 2019-04-14 (×4): qty 1, fill #0
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
## 2019-04-14 NOTE — Nurse Focus (Signed)
Ambulated to BR to void with FWW and stand by.  Did very well, tolerating ground diet, pain meds given for 8/10 pain, hadn't been medicated since approx 1330.

## 2019-04-14 NOTE — Plan of Care (Addendum)
Evaluation Note For All Goals  Pt alert/oriented, VSS, foley catheter removed and able to void in BSC, no BM. Aspen collar remains on. Worked with PT and able to take some side steps with FWW. Still has weakness and tone issues with BLE and LUE - MD aware. Right eye with some irritation - eye drops given PRN. Pt's incision site with dried drainage/intact, JP with scant sanguinous drainage.    Problem: Patient Care Overview  Goal: Plan of Care Review  Outcome: Ongoing (interventions implemented as appropriate)  Goal: Individualization and Mutuality  Outcome: Ongoing (interventions implemented as appropriate)  Goal: Discharge Needs Assessment  Outcome: Ongoing (interventions implemented as appropriate)  Goal: Interprofessional Rounds/Family Conf  Outcome: Ongoing (interventions implemented as appropriate)     Problem: Pain, Acute (Adult)  Goal: Identify Related Risk Factors and Signs and Symptoms  Description: Related risk factors and signs and symptoms are identified upon initiation of Human Response Clinical Practice Guideline (CPG).  Outcome: Ongoing (interventions implemented as appropriate)  Goal: Acceptable Pain Control/Comfort Level  Description: Patient will demonstrate the desired outcomes by discharge/transition of care.  Outcome: Ongoing (interventions implemented as appropriate)     Problem: Mobility, Physical Impaired (Adult)  Goal: Identify Related Risk Factors and Signs and Symptoms  Description: Related risk factors and signs and symptoms are identified upon initiation of Human Response Clinical Practice Guideline (CPG).  Outcome: Ongoing (interventions implemented as appropriate)  Goal: Enhanced Mobility Skills  Description: Patient will demonstrate the desired outcomes by discharge/transition of care.  Outcome: Ongoing (interventions implemented as appropriate)  Goal: Enhanced Functional Ability  Description: Patient will demonstrate the desired outcomes by discharge/transition of care.  Outcome:  Ongoing (interventions implemented as appropriate)     Problem: Fall Risk (Adult)  Goal: Identify Related Risk Factors and Signs and Symptoms  Description: Related risk factors and signs and symptoms are identified upon initiation of Human Response Clinical Practice Guideline (CPG).  Outcome: Ongoing (interventions implemented as appropriate)  Goal: Absence of Fall  Description: Patient will demonstrate the desired outcomes by discharge/transition of care.  Outcome: Ongoing (interventions implemented as appropriate)     Problem: Nutrition, Imbalanced: Inadequate Oral Intake (Adult)  Goal: Improved Oral Intake  Description: Patient will demonstrate the desired outcomes by discharge/transition of care.  Outcome: Ongoing (interventions implemented as appropriate)  Goal: Prevent Further Weight Loss  Description: Patient will demonstrate the desired outcomes by discharge/transition of care.  Outcome: Ongoing (interventions implemented as appropriate)

## 2019-04-14 NOTE — Nurse Assessment (Signed)
ASSESSMENT NOTE    Note Started: 04/14/2019, 07:16     Initial assessment completed.  Report received from night shift nurse and orders reviewed. Plan of Care reviewed and updated and appropriate, discussed with patient.  Valeria Krisko Roxanne Charlott Calvario, RN RN

## 2019-04-14 NOTE — Nurse Focus (Signed)
Report given to Alvira Philips, RN from D14. Addressed all questions and concerns. Pt alert/oriented, VSS, no acute changes, stable for transfer.

## 2019-04-14 NOTE — Plan of Care (Addendum)
Problem: Patient Care Overview  Goal: Plan of Care Review  Outcome: Ongoing (interventions implemented as appropriate)  Flowsheets (Taken 04/14/2019 0219)  Plan of Care Reviewed With: patient  Progress: no change  Outcome Summary: POD 1 post laminopasty from C-3 to C -7.dressing CDI.Pain managed with current pain regimen,F/C patent & draining well.JP with minimal out put.PT slept on & off nad,vss,Safety measures & needs attended.Will continue to monitor.  Goal: Individualization and Mutuality  Outcome: Ongoing (interventions implemented as appropriate)  Goal: Discharge Needs Assessment  Outcome: Ongoing (interventions implemented as appropriate)  Goal: Interprofessional Rounds/Family Conf  Outcome: Ongoing (interventions implemented as appropriate)     Problem: Pain, Acute (Adult)  Goal: Identify Related Risk Factors and Signs and Symptoms  Description: Related risk factors and signs and symptoms are identified upon initiation of Human Response Clinical Practice Guideline (CPG).  Outcome: Ongoing (interventions implemented as appropriate)  Goal: Acceptable Pain Control/Comfort Level  Description: Patient will demonstrate the desired outcomes by discharge/transition of care.  Outcome: Ongoing (interventions implemented as appropriate)     Problem: Mobility, Physical Impaired (Adult)  Goal: Identify Related Risk Factors and Signs and Symptoms  Description: Related risk factors and signs and symptoms are identified upon initiation of Human Response Clinical Practice Guideline (CPG).  Outcome: Ongoing (interventions implemented as appropriate)  Goal: Enhanced Mobility Skills  Description: Patient will demonstrate the desired outcomes by discharge/transition of care.  Outcome: Ongoing (interventions implemented as appropriate)  Goal: Enhanced Functional Ability  Description: Patient will demonstrate the desired outcomes by discharge/transition of care.  Outcome: Ongoing (interventions implemented as appropriate)      Problem: Fall Risk (Adult)  Goal: Identify Related Risk Factors and Signs and Symptoms  Description: Related risk factors and signs and symptoms are identified upon initiation of Human Response Clinical Practice Guideline (CPG).  Outcome: Ongoing (interventions implemented as appropriate)  Goal: Absence of Fall  Description: Patient will demonstrate the desired outcomes by discharge/transition of care.  Outcome: Ongoing (interventions implemented as appropriate)
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
## 2019-04-14 NOTE — Nurse Focus (Signed)
NP at bedside assessing LUE weakness.

## 2019-04-14 NOTE — Plan of Care (Signed)
Problem: Nutrition, Imbalanced: Inadequate Oral Intake (Adult)  Goal: Improved Oral Intake  Description: Patient will demonstrate the desired outcomes by discharge/transition of care.  Outcome: Ongoing (interventions implemented as appropriate)     Please refer to RD note filed under Allied Health for additional information.
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
## 2019-04-14 NOTE — Allied Health Progress (Signed)
ADULT INITIAL NUTRITION ASSESSMENT    Admission Date: 04/11/2019   Date of Service: 04/14/2019, 13:50     Reason For Assessment: RN Admission Risk Assessment      Nutrition Assessment     Admission Summary:   63yrold female with PMH of chronic LBP, s/p 2 lumbar surgeries and 1 cervical spine surgery, HTN, dyslipidemia who presented for paraparesis. Now s/p C3-7 laminoplasty.    Food & Nutrition Related History:   Interviewed patient over telephone. Reports good appetite PTA. Has been in bariatric program and receiving nutrition counseling over past 5-6 months with 29 lb intentional weight loss. Had planned for gastric bypass on May 17th. Was limiting sweets, bread, rice and beans for weight loss. Reports improving appetite since surgery. Requests ground diet d/t not having upper dentures in hospital. Agreeable to ONS and snacks for healing.     Food Allergies: NKFA    Nutrition Focused Physical Exam: Not indicated    Additional nutrition focused physical findings:  Potential signs of inflammation: elevated BG, increased WBC, increased neutrophils, acute disease (orthopedic surgery) and chronic disease (CVD and obesity)  Digestive Systems:    No upper teeth   LBM 5/3 x 4 loose  Skin:  Incision: back   Drain: JP    Anthropometrics:   Height: 162.6 cm (5' 4.02")    Admit Weight: 97.4 kg (214 lb 11.7 oz)  (04/11/19 ) (bed scale )   Most Recent Weight: 97.4 kg (214 lb 12.8 oz)  (04/11/19) (Bed scale )   Usual Body Weight: 97.1 kg (214 lb) ; was 243 lb 5-6 months ago  Weight Change: 29 lb  5-6 months  (intentional )   BMI: 36.95  kg/m^2  Ideal Body Weight: 55.04  kg  % Ideal Body Weight: 177.14 %  Adjusted Body Weight: 65.63  kg (adjusted for greater than 130% IBW)    Pertinent Labs: Reviewed     Pertinent Medications:   NaCl 0.9%, , IV, CONTINUOUS, Last Rate: 100 mL/hr at 04/14/19 1308    Dexamethasone (DECADRON) Injection 10 mg, IV, Q6H  Docusate (COLACE) Capsule 100 mg, ORAL, BID  Hydrochlorothiazide (HYDRO-DIURIL) Tablet  12.5 mg, ORAL, QAM  Pantoprazole (PROTONIX) Delayed Release Tablet 40 mg, ORAL, QAM AC  Sennosides (SENOKOT) Tablet 17.2 mg, ORAL, Daily Bedtime    Nutrition Order: Mechanical soft intact diet     Nutrition Order Adequacy: meets nutritional requirements   Nutrition Order Tolerance: tolerating     Estimated Nutrition Needs:    Calorie Requirements:  Calorie Dosing Wt: 97.4 kg (214 lb 11.7 oz)   Calorie estimation method: Mifflin-St Jeor   Calculation Factor: 1.05-1.3 (-) 300 kcal for weight loss   Calorie Needs: 1605 -1690  Protein Requirements:  Protein Dosing Wt: 65.6 kg (144 lb 11 oz)   g Protein/Kg/d: 1.2 -1.5   Protein Needs: 78.76 -98.45  Fluid Requirements:  Fluid Dosing Wt: 65.6 kg (144 lb 11 oz)   mL Fluid/Kg/d: 30 -  Fluid Needs: 17026.37-       Estimated Nutrition Intake: 5/3-5/4  PO 20% of documented meals     Calories Evaluation:  Enteral Calories (kcal): 0   Parenteral Calories (kcal): 0   Oral Calories (kcal): 430   Other Calories (kcal): 0   Total Calories (kcal): 430   % of Kcal Needs: 26.79 %  Protein Evaluation:  Enteral Protein (gm): 0   Parenteral Protein (gm): 0   Oral Protein (gm): 20   Other Protein (gm): 0  Total Protein (gm): 20   % of Protein Needs: 25.39 %  Fluid Evaluation:                       Nutrition Diagnosis     No diagnosis of malnutrition at this time.     Inadequate protein energy intake related to surgery, chewing difficulty and improving appetite as evidenced by 27% energy and 25% protein needs met over past 2 days.  - Status: New    Nutrition Intervention (Recommendations)     1) Meals and Snacks  - Change diet texture to ground per patient request  - RD to order Boost Plus BID (rotate flavors) and Boost Breeze (berry, peach) once daily   - RD to order snacks TID (Prosource Gelatein, pudding, yogurt)    Nutrition Monitoring & Evaluation (Goals)     Total energy intake: 1450+ kcal/day  Total protein intake: 70+ g of protein/day      Report Electronically Signed By: Nori Riis, MS, RD, CSSD, CNSC, FAND Pager [phone]

## 2019-04-14 NOTE — Nurse Focus (Signed)
Received from T6, AxOx4, calm and cooperative, 3/10 pain to neck, cervical collar in place, c/o N/T to bottom of L foot only, otherwise NVSS.  MD ordered ground diet, fluids provided. Denies CP, denies SOB, verbalizes understanding poc.  States she has only been OOB with PT to "march in place".  Will assist with pulmonary care and medicate for pain as needed.

## 2019-04-14 NOTE — Nurse Assessment (Signed)
ASSESSMENT NOTE    Note Started: 04/14/2019, 20:41     Initial assessment completed and recorded in EMR.  Report received from day shift nurse and orders reviewed. Plan of Care reviewed and appropriate, discussed with patient. VSS. Helped patient to the bathroom. No signs of dizziness. Call light within reach. Bed in low position. Levy Pupa, RN RN

## 2019-04-15 ENCOUNTER — Inpatient Hospital Stay (HOSPITAL_COMMUNITY): Payer: Commercial Managed Care - HMO

## 2019-04-15 DIAGNOSIS — Z981 Arthrodesis status: Secondary | ICD-10-CM

## 2019-04-15 DIAGNOSIS — Z4789 Encounter for other orthopedic aftercare: Secondary | ICD-10-CM

## 2019-04-15 LAB — CBC NO DIFFERENTIAL
Hematocrit: 32.3 % — ABNORMAL LOW (ref 36.0–46.0)
Hematocrit: 30.1 % — ABNORMAL LOW (ref 36.0–46.0)
Hemoglobin: 10.7 g/dL — ABNORMAL LOW (ref 12.0–16.0)
Hemoglobin: 10.2 g/dL — ABNORMAL LOW (ref 12.0–16.0)
MCH: 33.1 pg — ABNORMAL HIGH (ref 27.0–33.0)
MCH: 32.6 pg (ref 27.0–33.0)
MCHC: 33.3 % (ref 32.0–36.0)
MCHC: 34 % (ref 32.0–36.0)
MCV: 99.5 fL (ref 80.0–100.0)
MCV: 95.9 fL (ref 80.0–100.0)
MPV: 10.5 fL — ABNORMAL HIGH (ref 6.8–10.0)
MPV: 9.7 fL (ref 6.8–10.0)
Platelet Count: 109 10*3/uL — ABNORMAL LOW (ref 130–400)
Platelet Count: 261 10*3/uL (ref 130–400)
Platelet Estimate, Smear: DECREASED — AB
RDW: 13.6 % (ref 0.0–14.7)
RDW: 13 % (ref 0.0–14.7)
Red Blood Cell Count: 3.25 10*6/uL — ABNORMAL LOW (ref 4.00–5.20)
Red Blood Cell Count: 3.14 10*6/uL — ABNORMAL LOW (ref 4.00–5.20)
White Blood Cell Count: 13.6 10*3/uL — ABNORMAL HIGH (ref 4.5–11.0)
White Blood Cell Count: 14.8 10*3/uL — ABNORMAL HIGH (ref 4.5–11.0)

## 2019-04-15 LAB — MAGNESIUM (MG): Magnesium (Mg): 1.9 mg/dL (ref 1.5–2.6)

## 2019-04-15 LAB — BASIC METABOLIC PANEL
Calcium: 9.1 mg/dL (ref 8.6–10.5)
Carbon Dioxide Total: 22 mmol/L — ABNORMAL LOW (ref 24–32)
Chloride: 109 mmol/L (ref 95–110)
Creatinine Serum: 0.69 mg/dL (ref 0.44–1.27)
E-GFR Creatinine (Female): 95 mL/min/{1.73_m2}
E-GFR, African American (Female): >100 mL/min/{1.73_m2}
Glucose: 151 mg/dL — ABNORMAL HIGH (ref 70–99)
Potassium: 4.3 mmol/L (ref 3.3–5.0)
Sodium: 140 mmol/L (ref 135–145)
Urea Nitrogen, Blood (BUN): 12 mg/dL (ref 8–22)

## 2019-04-15 LAB — PHOSPHORUS (PO4): Phosphorus (PO4): 1.9 mg/dL — ABNORMAL LOW (ref 2.4–5.0)

## 2019-04-15 NOTE — Nurse Assessment (Signed)
ASSESSMENT NOTE    Note Started: 04/15/2019, 19:15     Initial assessment completed and recorded in EMR.  Report received from day shift nurse and orders reviewed. Plan of Care reviewed and appropriate, discussed with patient. Pt AOx4, moving all extremities, denies SOB, pain discomfort. Assisted to reposition patient  Into right side, log rolling. Moving all extremities. Call light within reach, bed locked, low position. Will continue to monitor patient.   Ladell Pier, RN

## 2019-04-15 NOTE — Plan of Care (Signed)
VSS. Patient on RA sating %98. Pain controlled with Oxycodone 15 mg Q3h. Patient ambulates to the bathroom by walker. JP output about 2-3 ml the entire night.     Problem: Patient Care Overview  Goal: Plan of Care Review  Outcome: Ongoing (interventions implemented as appropriate)  Goal: Individualization and Mutuality  Outcome: Ongoing (interventions implemented as appropriate)  Goal: Discharge Needs Assessment  Outcome: Ongoing (interventions implemented as appropriate)  Goal: Interprofessional Rounds/Family Conf  Outcome: Ongoing (interventions implemented as appropriate)     Problem: Pain, Acute (Adult)  Goal: Identify Related Risk Factors and Signs and Symptoms  Description: Related risk factors and signs and symptoms are identified upon initiation of Human Response Clinical Practice Guideline (CPG).  Outcome: Ongoing (interventions implemented as appropriate)  Goal: Acceptable Pain Control/Comfort Level  Description: Patient will demonstrate the desired outcomes by discharge/transition of care.  Outcome: Ongoing (interventions implemented as appropriate)     Problem: Mobility, Physical Impaired (Adult)  Goal: Identify Related Risk Factors and Signs and Symptoms  Description: Related risk factors and signs and symptoms are identified upon initiation of Human Response Clinical Practice Guideline (CPG).  Outcome: Ongoing (interventions implemented as appropriate)  Goal: Enhanced Mobility Skills  Description: Patient will demonstrate the desired outcomes by discharge/transition of care.  Outcome: Ongoing (interventions implemented as appropriate)  Goal: Enhanced Functional Ability  Description: Patient will demonstrate the desired outcomes by discharge/transition of care.  Outcome: Ongoing (interventions implemented as appropriate)     Problem: Fall Risk (Adult)  Goal: Identify Related Risk Factors and Signs and Symptoms  Description: Related risk factors and signs and symptoms are identified upon initiation of  Human Response Clinical Practice Guideline (CPG).  Outcome: Ongoing (interventions implemented as appropriate)  Goal: Absence of Fall  Description: Patient will demonstrate the desired outcomes by discharge/transition of care.  Outcome: Ongoing (interventions implemented as appropriate)     Problem: Nutrition, Imbalanced: Inadequate Oral Intake (Adult)  Goal: Improved Oral Intake  Description: Patient will demonstrate the desired outcomes by discharge/transition of care.  Outcome: Ongoing (interventions implemented as appropriate)  Goal: Prevent Further Weight Loss  Description: Patient will demonstrate the desired outcomes by discharge/transition of care.  Outcome: Ongoing (interventions implemented as appropriate)

## 2019-04-15 NOTE — Discharge Planning (AHS/AVS) (Signed)
[facility] Non-Medical Transportation to Your Provider  First, make your appointment or schedule your class. Then, call the Customer Service Department at: [phone], or [phone]), toll-free [phone] (TDD). We will verify your appointment and send you public bus transportation vouchers.

## 2019-04-15 NOTE — Plan of Care (Signed)
Problem: Patient Care Overview  Goal: Plan of Care Review  Outcome: Ongoing (interventions implemented as appropriate)  Flowsheets (Taken 04/15/2019 1714)  Plan of Care Reviewed With: patient  Outcome Summary: VSS, pain is managed and controlled with oxycodone 15mg  PO, JP drain removed at around 1600, dressing intact. Ambulates to the bathroom using FWW with SBA. Physical medicine/rehabilitation MD done. Continue plan of care. Plan to d/c home with [facility].  Goal: Individualization and Mutuality  Outcome: Ongoing (interventions implemented as appropriate)  Goal: Discharge Needs Assessment  Outcome: Ongoing (interventions implemented as appropriate)  Goal: Interprofessional Rounds/Family Conf  Outcome: Ongoing (interventions implemented as appropriate)     Problem: Pain, Acute (Adult)  Goal: Identify Related Risk Factors and Signs and Symptoms  Description: Related risk factors and signs and symptoms are identified upon initiation of Human Response Clinical Practice Guideline (CPG).  Outcome: Ongoing (interventions implemented as appropriate)  Goal: Acceptable Pain Control/Comfort Level  Description: Patient will demonstrate the desired outcomes by discharge/transition of care.  Outcome: Ongoing (interventions implemented as appropriate)     Problem: Mobility, Physical Impaired (Adult)  Goal: Identify Related Risk Factors and Signs and Symptoms  Description: Related risk factors and signs and symptoms are identified upon initiation of Human Response Clinical Practice Guideline (CPG).  Outcome: Ongoing (interventions implemented as appropriate)  Goal: Enhanced Mobility Skills  Description: Patient will demonstrate the desired outcomes by discharge/transition of care.  Outcome: Ongoing (interventions implemented as appropriate)  Goal: Enhanced Functional Ability  Description: Patient will demonstrate the desired outcomes by discharge/transition of care.  Outcome: Ongoing (interventions implemented as appropriate)

## 2019-04-15 NOTE — Consults (Addendum)
NEUROLOGY RESIDENT PGY-3 PROGRESS NOTE                                     PATIENT:  Rhonda Horton  MRN:         [id number]    NOTE DATE / TIME:  04/15/2019  @ 13:26    ADMISSION DATE:  04/11/2019    ID:  Rhonda Horton is a 63yr-old female with past medical history of HTN, hypothyroidism, chronic low back pain s/p C4-5 ACDF 5 years ago who presented to outside hospital with complaints of 2 days of inability to walk, found to have cervical cord compression, initially admitted to Neurology but now s/p C3-7 laminoplasty and C5-7 foraminotomy with Ortho spine on 5/4. Neurology consulted as patient is having worsened left arm weakness post operatively.     24 HOUR EVENTS:   - Neurology consulted regarding worsened weakness of LUE  - Plt drop from 257 to 109     S: Patient reports that since surgery, her LUE has been weak, but has been improving today. She felt tingling previously which she does not feel now. Her arm remains weak but she thinks mostly at the wrist.     O:  CURRENT MEDICATIONS  Scheduled:Acetaminophen (TYLENOL) Tablet 1,000 mg, ORAL, Q6H  Amlodipine (NORVASC) Tablet 5 mg, ORAL, QAM  Atorvastatin (LIPITOR) Tablet 20 mg, ORAL, Daily Bedtime  BuPROPion (WELLBUTRIN XL) XL 24hr Tablet 450 mg, ORAL, QAM  Docusate (COLACE) Capsule 100 mg, ORAL, BID  [Held by provider] Enoxaparin (LOVENOX) Injection 40 mg, SUBCUTANEOUS, Q24H  Erythromycin 0.5% Ophthalmic Ointment 0.5 inch, RIGHT Eye, QID  Escitalopram (LEXAPRO) Tablet 40 mg, ORAL, QAM  Gabapentin (NEURONTIN) Capsule 400 mg, ORAL, TID  Hydrochlorothiazide (HYDRO-DIURIL) Tablet 12.5 mg, ORAL, QAM  LevoTHYROxine Tablet 137 mcg, ORAL, QAM AC  Metoprolol Tartrate (LOPRESSOR) Tablet 50 mg, ORAL, BID  Nicotine (NICODERM CQ) 7 mg/24 hr Patch 1 patch, Transdermal, Q24H Now  Nicotine patch REMOVAL 1 patch, Transdermal, Q24H Now  Pantoprazole (PROTONIX) Delayed Release Tablet 40 mg, ORAL, QAM AC  Sennosides (SENOKOT) Tablet 17.2 mg, ORAL, Daily Bedtime    IV Fluids &  Drips:  NaCl 0.9%, , IV, CONTINUOUS, Last Rate: 100 mL/hr at 04/14/19 1716    XMD:[id number] Tears (LIQUIFILM TEARS) 1.4 % Ophthalmic Solution 1 drop, RIGHT Eye, Q4H PRN  Bisacodyl (DULCOLAX) Suppository 10 mg, RECTALLY, Q12H PRN  Diazepam (VALIUM) Tablet 2.5-5 mg, ORAL, Q6H PRN  DiphenhydrAMINE (BENADRYL) Injection 25-50 mg, IV, Q6H PRN  HydrALAZINE (APRESOLINE) Injection 5 mg, IV, Q6H PRN  Hydromorphone (DILAUDID) Injection 0.2-0.6 mg, IV, Q3H PRN  Hydromorphone (DILAUDID) Injection 0.2-0.6 mg, IV, PRN  Magnesium Hydroxide (MILK OF MAGNESIA) 400 mg/5 mL Suspension 30 mL, ORAL, Q12H PRN  Magnesium Sulfate 2 grams in Sterile Water 50 mL IVPB, IV, PRN  Melatonin Tablet 3 mg, ORAL, Bedtime PRN  Metoclopramide (REGLAN) Injection 10 mg, IV, Q6H PRN  Ondansetron (ZOFRAN) Injection 4 mg, IV, Q12H PRN  Ondansetron (ZOFRAN-ODT) Rapid Dissolve Tablet 4 mg, ORAL, Q8H PRN  Oxycodone (ROXICODONE) Tablet 5-15 mg, ORAL, Q3H PRN  Polyethylene Glycol 3350 (MIRALAX) Oral Powder Packet 17 g, ORAL, BID prn       VITALS:     Current  Minimum Maximum   BP BP: 147/83  BP: (134-147)/(63-85)    Temp Temp: 36.8 C (98.2 F)  Temp Min: 36.8 C (98.2 F)  Temp Max: 37.3 C (99.1 F)  Pulse Pulse: 72 Pulse Min: 72  Pulse Max: 83    Resp Resp: 16 Resp Min: 16  Resp Max: 18    O2 Sat SpO2: 96 % SpO2 Min: 93 % SpO2 Max: 98 %   O2 Deliv None ; No Data Recorded      SpO2: 96 %  Flow (L/min): 1  Pulse: 72    24 HOUR INTAKE/OUTPUT:  I/O Last 2 Completed Shifts:  In: 1901 [Oral:690; Crystalloid:1211]  Out: 2410 [Urine:2400; Other:10]    EXAM  Mental status:  Awake, alert, oriented to person, place, time, situation  Cranial nerves:  PERRL, EOMI, face symmetric  Motor:  RUE: 5/5 throughout; LUE: 5/5 proximally, wrist/finger abduction 4/5  Reflexes:  3+ throughout RUE/RLE, 2+ LUE  Sensory:  Diminished to temperature and LT entire left arm  Coord:  NT    DIAGNOSTIC STUDIES  (Retired) POC Glucose, blood: --  Lab Results - 24 hours (excluding micro and  POC)   CBC NO DIFFERENTIAL     Status: Abnormal   Result Value Status    White Blood Cell Count 13.6 (H) Final    Red Blood Cell Count 3.25 (L) Final    Hemoglobin 10.7 (L) Final    Hematocrit 32.3 (L) Final    MCV 99.5 Final    MCH 33.1 (H) Final    MCHC 33.3 Final    RDW 13.6 Final    MPV 10.5 (H) Final    Platelet Count 109 (L) Final    Platelet Estimate, Smear Decreased (Abnl) Final   MAGNESIUM (MG)     Status: Normal   Result Value Status    Magnesium (Mg) 1.9 Final   PHOSPHORUS (PO4)     Status: Abnormal   Result Value Status    Phosphorus (PO4) 1.9 (L) Final   BASIC METABOLIC PANEL     Status: Abnormal   Result Value Status    Sodium 140 Final    Potassium 4.3 Final    Chloride 109 Final    Carbon Dioxide Total 22 (L) Final    Urea Nitrogen, Blood (BUN) 12 Final    Creatinine Serum 0.69 Final    Glucose 151 (H) Final    Calcium 9.1 Final    E-GFR, African American (Female) >100 Final    E-GFR, Non-African American (Female) 95 Final     MRI Brain 04/12/19  IMPRESSION:  1. Status post ACDF C4-C5 with cord signal hyperintensity and volume  loss at C5 consistent with myelomalacia as well as spanning the C4 level.  No abnormal enhancement.  2. 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    A/P:  Rhonda Horton is a 63yr-old female with past medical history of HTN, hypothyroidism, chronic low back pain s/p C4-5 ACDF 5 years ago who presented to outside hospital with complaints of 2 days of inability to walk, found to have cervical cord compression, now s/p C3-7 laminoplasty and C5-7 foraminotomy with Ortho spine on 5/4. Neurology consulted as patient is having worsened left arm weakness post operatively. Patient's left arm weakness on examination is primarily distal weakness, but she does have a sensory deficit of the entire left arm. Given this, would re-image C-spine with MRI, and also image brachial plexus for causes. Not consistent with stroke. Can obtain EMG as an outpatient if symptoms  not improving.    PLAN / RECOMMENDATIONS:  1) Recommend MRI C-spine and brachial plexus with and without contrast     2) Continue physical  therapy    3) If no improvement, outpatient EMG/NCS in 3-4 weeks and Neurology follow up      Patient seen and case discussed with Dr. Jonni SangerKanth.     Report Electronically Signed By:  Silver HugueninShaan Ludder, D.O.  Neurology, PGY-3  [facility]  Pager: [phone]  Neuro Consult Pager: [phone]  PI: [phone]    Attending addendum  The patient was seen and examined.  As the patient developed the left arm sensory changes and weakness only since the surgery, the symptoms may be secondary to a perioperative process. Exam was notable for touch and temperature loss in the left arm in all dermatomes up to the shoulder. She has fairly good strength with shoulder abduction limited by pain but at least a 4/5, elbow flexion/extension 5/5, wrist extension 4/5, wrist flexion nearly 5/5, finger abduction barely 4/5 on the left. She is pathologically brisk in the right arm 3+ and left arm but less spreading on the left than right and 3+ in the patellars. Her bilateral feet appear to have contractures with increased tone. Could not test for clonus at ankles. Toes up going bilaterally. This suggests an upper motor neuron process consistent with known prior cervical encephalomalacia but the asymmetry in the left arm suggests a lower motor neuron process such as the the cervical nerve roots or brachial plexus. MRI C spine and brachial plexus with and without contrast will be helpful to see if any peri-surgical or post-surgical complications have developed structurally.  I reviewed and agree with the resident's assessment and plan we developed as outlined in the resident's note.  Report electronically signed by Cecille PoKiran Madisetty Herbert Aguinaldo, MD Attending.     Wynetta FinesKiran Katherine Tout, M.D.  Assistant Clinical Professor  Department of Neurology - Epilepsy
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
## 2019-04-15 NOTE — Nurse Focus (Signed)
At around 0915 pt came back from xray via gurney with one HA. Sunnie Nielsen, RN

## 2019-04-15 NOTE — Allied Health Progress (Signed)
PM&R--ACUTE CARE SERVICE  PHYSICAL THERAPY TREATMENT NOTE    Date of Service: 04/15/2019     Treatment Day 2 of 10 in reporting period.    Time in: 1135      S: Left ankle getting a bit better in terms of being able to move it to dorsiflexion       O:      04/15/19 1135   Treatment Time/Intention   Discipline physical therapy assistant   Document Type therapy note (daily note)   Mode of Treatment individual therapy   Patient/Family Observations Neck and shoulders tight and sore. Left ankle range of motion improved per pt. Agreeable for activity.   Care Plan Review care plan/treatment goals reviewed   Total Minutes, Physical Therapy Treatment 25   Patient Effort good   Treatment Considerations/Comments Supine with HOB up, slumped in bed, collar on.   Patient Response to Treatment positive   Cognitive Assessment/Intervention   Affect/Mental Status (Cognitive) WFL   Bed Mobility Assessment/Treatment   Supine-Sit Independence (Bed Mobility) minimum assist (75% patient effort)  (to trunk, needed raised head of bed)   Sit-Supine Independence (Bed Mobility) moderate assist (50% patient effort)  (to assist bilateral legs back into bed  seated spin)   Assistive Device (Bed Mobility) bed rails  (HOB up)   Comment (Bed Mobility) Currently directing care for assist due to pain, will need to train for log roll without bed features. Today respecting pain levels, using bed.   Transfer Assessment/Treatment   Sit-Stand Independence (Transfers)   (standby assist, mild struggle to stand, 2 attempts needed)   Stand-Sit Independence (Transfers) supervision;verbal cues   Systems developerit-Stand Transfer   Assistive Device (Sit-Stand Transfers) walker, front-wheeled  (needs shorty walker for improved UE/shoulder comfort)   Quarry managertand-Sit Transfer   Assistive Device (Stand-Sit Transfers) walker, front-wheeled   Gait/Stairs Assessment/Training   Independence Level (Gait)   (standby assist due to unsteady trunk. leg support)   Assistive Device (Gait) walker,  front-wheeled   Distance in Feet (Gait) 40   Comment (Gait/Stairs) slow pace and working hard for LE support and trunk control   Safety Issues, Functional Mobility   Impairments Affecting Function (Mobility) endurance/activity tolerance;pain;postural/trunk Teacher, adult educationcontrol;strength   Motor Skills Assessment/Interventions   Additional Documentation Therapeutic Exercise (Group)   Therapeutic Exercise   Lower Extremity (Therapeutic Exercise) heel slides, left;heel slides, right;quad sets, left;quad sets, right;gastroc stretch, left;gastroc stretch, right  (passive stretch L calf against blanket roll at foot of bed)   Lower Extremity Range of Motion (Therapeutic Exercise) hip abduction/adduction, right;hip abduction/adduction, left;ankle dorsiflexion/plantar flexion, left;ankle dorsiflexion/plantar flexion, right   Comment (Therapeutic Exercise) Handout issued. Noting left ankle clonus during calf stretching with ball of foot pressing against blanket roll into DF.   Pain Assessment   Additional Documentation Pain Scale: Numbers Pre/Post-Treatment (Group)   Pain Scale: Numbers Pre/Post-Treatment   Pain Scale: Numbers, Pretreatment 8/10   Pain Scale: Numbers, Post-Treatment 7/10   Pain Location - Side Bilateral   Pain Location shoulder   Modality Treatment   Additional Documentation Modality Treatment (Group)   Outcome Summary/Treatment Plan (PT)   Daily Summary of Progress (PT) progress toward functional goals is good   Barriers to Overall Progress (PT) PF contracture left lower leg, neck and shoulder discomfort   Plan for Continued Treatment (PT) continue to wean bed mobility from use of features, continue strengthening LLE, left DF stretching, gait FWW   General Information   Existing Precautions/Restrictions   (collar on at all times)   Plan  for Physical Therapy Intervention   Manual Therapy Techniques short bout of STM once reclined in bed after treatment, finding tight levator scapula and UT on the right > left. Unable to  get pain rating after that as Child psychotherapist in room and didn't want to interrupt.     Ace wrapped left ankle for "toe lifter" for gait and standing activity.     Noted to drag right toes as well when fatigues, trendelenberg gait.    Skilled treatment rendered during above session including cues, guarding, patient education and functional training as necessary.    Patient continues supine in bed in collar, call button in reach, report to RN re above.    A: Primary deficit is limited bed mobility as noted. Fair gait, poor endurance, limited by UE support on walker in setting of aggravated shoulder/neck.  Directs care well. Unable for use of strap to draw L ankle into dorsiflexion stretch, so used a blanket roll on foot of bed instead to support ball of foot as she presses heel.      P: Continue with plan of care towards goals as noted in flowsheet above.       Electronically Signed by:  Leslye Peer PTA  PI# [id number]

## 2019-04-15 NOTE — Consults (Incomplete)
PM&R Inpatient Consultation Note    Date of service: 04/15/2019    Reason for consultation: ***  Requesting service: (A) Orthopedics  Requesting attending: Mila Homer, MD    History was obtained from review of electronic medical record,  please comment here if you obtained information from the primary treating team or family members, also comment if patient was or was not able to provide information.    HISTORY OF PRESENT ILLNESS: Rhonda Horton is a 63yr old RHD/LHD *** female admitted 04/11/2019 for [facility] Course:  - ***    Current symptoms:   Location: ***  Quality:  pain characteristics, or other symptoms if not pain such as weakness, numbness, difficulty mobilizing, etc  Severity: ***  Timing: ***  Context: ***  modifying factors: ***   associated signs and symptoms: ***    Injuries include:  ***    Procedures this hospitalization:  ***    Precautions:  comment here on any weight bearing restrictions, isolation status, bracing needs and specifics of bracing - such as for comfort only, if needed at all times or only when out of bed, if needed during showering, etc    Services Consulted during Hospitalization:  -  and comment on recommendations or management of the consultants    Baseline Functional Status: *** Independent in mobility without assistive device.  *** Independent with ADL's without assistive device.    Current Functional Status:   Date  04/15/2019     Communication    Bladder management    Last bowel movement    Nutrition    Bed mobility Min to ModA    Transfers: with minimal assist   ADL's  PMR NEURO ASSIST:11633::"independenty"   Gait with minimal assist   Cognition/Memory  not formally tested by ST ***       Primary Care Doctor/Specialists:  - Patient, No Pcp Per@PCPPRILOC @None     REVIEW OF SYSTEMS:   Constitutional: *** sleeping well, good appetite, no fevers or chills  Eyes: ***, no visual changes  ENMT: *** , no hearing changes   Cardiovascular: *** no chest pain, no leg swelling  Respiratory: *** no cough, no shortness of breath   Gastrointestinal: *** no diarrhea, no constipation  Genitourinary: *** able to void, no dysuria, frequency, or incontinence  MSK: *** no joint pain or effusion  Skin: *** no rash or bruising  Endocrine: ***, no heat/cold intolerance  Neurologic: ***  Psychiatric: *** mood    SOCIAL HISTORY:   Social History     Socioeconomic History    Marital status: DIVORCED     Spouse name: Not on file    Number of children: Not on file    Years of education: Not on file    Highest education level: Not on file   Occupational History    Not on file   Social Needs    Financial resource strain: Not on file    Food insecurity     Worry: Not on file     Inability: Not on file    Transportation needs     Medical: Not on file     Non-medical: Not on file   Tobacco Use    Smoking status: Former Smoker     Types: Cigarettes    Smokeless tobacco: Former Neurosurgeon     Types: Snuff   Substance and Sexual Activity    Alcohol use: Yes     Alcohol/week: 3.0 standard drinks  Types: 3 Glasses of wine per week     Comment: monthly    Drug use: Never    Sexual activity: Not on file   Lifestyle    Physical activity     Days per week: Not on file     Minutes per session: Not on file    Stress: Not on file   Relationships    Social connections     Talks on phone: Not on file     Gets together: Not on file     Attends religious service: Not on file     Active member of club or organization: Not on file     Attends meetings of clubs or organizations: Not on file     Relationship status: Not on file    Intimate partner violence     Fear of current or ex partner: Not on file     Emotionally abused: Not on file     Physically abused: Not on file     Forced sexual activity: Not on file   Other Topics Concern    Not on file   Social History Narrative    Not on file      Currently works as a ***. Longest period of employment has been ***.  Highest level of education is ***.    Lives in *** with *** in a *** story home with *** step(s) to enter.   *** alcohol, *** tobacco, *** drug use.    Discharge Disposition: *** available for 24/7 supervision and *** physical assistance.    Funding: Payor: MEDICARE RISK / Plan: [employer] GOLD [employer]US MCRRISK HMO / MEDCORE / Product Type: *No Product type* /  ***    PAST MEDICAL HISTORY:   Past Medical History:   Diagnosis Date    Arthritis     Balance problem     H/O shortness of breath     Hypertension     pt denied, but pt taking HTN meds    Psychiatric illness     pt denied pschye illness, but pt is taking multiple psyche meds, pt claimed it's for nervous breakdown     If unchanged from what autopopulates, then must state reviewed in EMR and unchanged- add any additional PMH  ***   PAST SURGICAL HISTORY:   Past Surgical History:   Procedure Laterality Date    PR LAM W/O FACETEC FORAMOT/[facility] 1/2 VRT SEG, LUMBAR      Laminectomy, lumbar    PR PARTIAL REMOVAL OF KIDNEY      Nephrectromy     If unchanged from what autopopulates, then must state reviewed in EMR and unchanged- add any additional PSH  ***  FAMILY HISTORY:   No family history on file.  DO NOT LEAVE AS "NO FAMILY HISTORY ON FILE."  NEED TO GO BACK AND ASK PATIENT FAMILY HISTORY TO COMPLETE THIS SECTION, especially add family history either + or - that may be associated with reason for admission.  Non-contributory is not an acceptable response, even for trauma patient.  OK to comment that patient is unable to give history for a specific reason, such as aphasia.    ALLERGIES: No Known Allergies    HOME MEDICATIONS:   No current facility-administered medications on file prior to encounter.      Current Outpatient Medications on File Prior to Encounter   Medication Sig Dispense Refill    AMLODIPINE BESYLATE, BULK, MISC Take 5 mg by mouth every day.  Atorvastatin (LIPITOR) 20 mg Tablet Take 20 mg by mouth every day.       BuPROPion (WELLBUTRIN XL) 150 mg XL tablet Take 150 mg by mouth every morning. Takes with 300 mg for 450 mg XL daily                BuPROPion (WELLBUTRIN XL) 300 mg XL tablet Take 300 mg by mouth every morning. Takes with 150 mg for total of 450 mg XL daily                Cholecalciferol, Vitamin D3, (VITAMIN D-3) 10 mcg (400 unit) Capsule Take 1 tablet by mouth every day.      ClonazePAM (KLONOPIN) 0.5 mg Tablet Take 0.5 mg by mouth 2 times daily.      Hydrochlorothiazide (MICROZIDE) 12.5 mg capsule Take 12.5 mg by mouth every morning.      Hydrocodone 10 mg/Acetaminophen 325 mg (NORCO) Tablet Take 1 tablet by mouth two times daily if needed.      LevoTHYROxine 137 mcg tablet Take 137 mcg by mouth every morning before a meal.      MECLIZINE HCL, BULK, MISC Take 12.5 mg by mouth two times daily if needed.      Metoprolol Tartrate (LOPRESSOR) 50 mg Tablet Take 50 mg by mouth 2 times daily.      Omeprazole (PRILOSEC) 20 mg Delayed Release Capsule Take 20 mg by mouth once daily before a meal.      Oxybutynin (DITROPAN) 5 mg Tablet Take 5 mg by mouth three times daily if needed (urinary discomfort).                 Topiramate (TOPAMAX) 50 mg tablet Take 50 mg by mouth 2 times daily.       CURRENT MEDICATIONS:   SCHEDULED MEDICATIONS:  Acetaminophen (TYLENOL) Tablet 1,000 mg, ORAL, Q6H  Amlodipine (NORVASC) Tablet 5 mg, ORAL, QAM  Atorvastatin (LIPITOR) Tablet 20 mg, ORAL, Daily Bedtime  BuPROPion (WELLBUTRIN XL) XL 24hr Tablet 450 mg, ORAL, QAM  Dexamethasone (DECADRON) Injection 10 mg, IV, Q6H  Docusate (COLACE) Capsule 100 mg, ORAL, BID  [Held by provider] Enoxaparin (LOVENOX) Injection 40 mg, SUBCUTANEOUS, Q24H  Erythromycin 0.5% Ophthalmic Ointment 0.5 inch, RIGHT Eye, QID  Escitalopram (LEXAPRO) Tablet 40 mg, ORAL, QAM  Gabapentin (NEURONTIN) Capsule 400 mg, ORAL, TID  Hydrochlorothiazide (HYDRO-DIURIL) Tablet 12.5 mg, ORAL, QAM  LevoTHYROxine Tablet 137 mcg, ORAL, QAM AC   Metoprolol Tartrate (LOPRESSOR) Tablet 50 mg, ORAL, BID  Nicotine (NICODERM CQ) 7 mg/24 hr Patch 1 patch, Transdermal, Q24H Now  Nicotine patch REMOVAL 1 patch, Transdermal, Q24H Now  Pantoprazole (PROTONIX) Delayed Release Tablet 40 mg, ORAL, QAM AC  Sennosides (SENOKOT) Tablet 17.2 mg, ORAL, Daily Bedtime      PRN MEDICATIONS:  Artificial Tears (LIQUIFILM TEARS) 1.4 % Ophthalmic Solution 1 drop, RIGHT Eye, Q4H PRN  Bisacodyl (DULCOLAX) Suppository 10 mg, RECTALLY, Q12H PRN  Diazepam (VALIUM) Tablet 2.5-5 mg, ORAL, Q6H PRN  DiphenhydrAMINE (BENADRYL) Injection 25-50 mg, IV, Q6H PRN  HydrALAZINE (APRESOLINE) Injection 5 mg, IV, Q6H PRN  Hydromorphone (DILAUDID) Injection 0.2-0.6 mg, IV, Q3H PRN  Hydromorphone (DILAUDID) Injection 0.2-0.6 mg, IV, PRN  Magnesium Hydroxide (MILK OF MAGNESIA) 400 mg/5 mL Suspension 30 mL, ORAL, Q12H PRN  Magnesium Sulfate 2 grams in Sterile Water 50 mL IVPB, IV, PRN  Melatonin Tablet 3 mg, ORAL, Bedtime PRN  Metoclopramide (REGLAN) Injection 10 mg, IV, Q6H PRN  Ondansetron (ZOFRAN) Injection 4 mg, IV, Q12H PRN  Ondansetron (  ZOFRAN-ODT) Rapid Dissolve Tablet 4 mg, ORAL, Q8H PRN  Oxycodone (ROXICODONE) Tablet 5-15 mg, ORAL, Q3H PRN  Polyethylene Glycol 3350 (MIRALAX) Oral Powder Packet 17 g, ORAL, BID prn      PHYSICAL EXAMINATION:  BP 134/85
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
## 2019-04-15 NOTE — Nurse Focus (Signed)
Pt voided at around 1800 PVR 54ml.  Sunnie Nielsen, RN (pls see notes of MD Jonni Sanger. Sunnie Nielsen, RN
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
## 2019-04-15 NOTE — Nurse Assessment (Signed)
ASSESSMENT NOTE    Note Started: 04/15/2019, 07:22     Initial assessment completed and recorded in EMR.  Report received from night shift nurse and orders reviewed. Plan of Care reviewed and updated, discussed with patient. Pt is awake, no SOB, denies chest pain c/o pain to incisional 6/10, no numbness or tingling, aspen collar on, JP x 1, bed placed on lowest position. Call light within reach.  Darin Engels Loa Socks,  RN
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
## 2019-04-15 NOTE — Procedures (Signed)
DEPARTMENT OF ORTHOPAEDIC SURGERY   MINOR BEDSIDE PROCEDURE NOTE    Date of service: 04/15/2019             Pre-Procedure Diagnosis: S/p C3-7 laminopasty C3-7 laminopasty   Post-Procedure Diagnosis: same  Indication: Drain removal, no longer indicated  Anesthesia: none    Procedure Performed / Description: Drain Removal  The patient is POD: 2. Drain output over the last 24 hours is 51ml. It is no longer indicated.   Drainage description: Serosanguionous fluid  Wound description: drain site clear, no bleeding at exit site  Dressing over wound: 2x2 gauze with transparent dressing.  Complications: None. Patient tolerated well.  Outcome: Uneventful removal of hemovac drain that was no longer indicated.    Kerry Hough, FNP-C  Nurse Practitioner   PI: [id number]

## 2019-04-15 NOTE — Nurse Focus (Signed)
Spoke with NP Johnny Bridge about patient big drop of platelet count from 257 to 109 as first dose of lovenox 40mg  to be given. NP Johnny Bridge said to give the first dose and will just monitor the platelet. Lovenox given at around 1000. Sunnie Nielsen, RN
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
## 2019-04-16 LAB — CBC NO DIFFERENTIAL
Hematocrit: 29.7 % — ABNORMAL LOW (ref 36.0–46.0)
Hemoglobin: 10.1 g/dL — ABNORMAL LOW (ref 12.0–16.0)
MCH: 33 pg (ref 27.0–33.0)
MCHC: 34.1 % (ref 32.0–36.0)
MCV: 96.8 fL (ref 80.0–100.0)
MPV: 9 fL (ref 6.8–10.0)
Platelet Count: 268 10*3/uL (ref 130–400)
RDW: 13.2 % (ref 0.0–14.7)
Red Blood Cell Count: 3.07 10*6/uL — ABNORMAL LOW (ref 4.00–5.20)
White Blood Cell Count: 11 10*3/uL (ref 4.5–11.0)

## 2019-04-16 LAB — BASIC METABOLIC PANEL
Calcium: 9.5 mg/dL (ref 8.6–10.5)
Carbon Dioxide Total: 28 mmol/L (ref 24–32)
Chloride: 104 mmol/L (ref 95–110)
Creatinine Serum: 0.79 mg/dL (ref 0.44–1.27)
E-GFR Creatinine (Female): 82 mL/min/{1.73_m2}
E-GFR, African American (Female): 94 mL/min/{1.73_m2}
Glucose: 135 mg/dL — ABNORMAL HIGH (ref 70–99)
Potassium: 3.6 mmol/L (ref 3.3–5.0)
Sodium: 141 mmol/L (ref 135–145)
Urea Nitrogen, Blood (BUN): 15 mg/dL (ref 8–22)

## 2019-04-16 LAB — MAGNESIUM (MG): Magnesium (Mg): 1.9 mg/dL (ref 1.5–2.6)

## 2019-04-16 LAB — PHOSPHORUS (PO4): Phosphorus (PO4): 2.2 mg/dL — ABNORMAL LOW (ref 2.4–5.0)

## 2019-04-16 MED ORDER — LORAZEPAM 2 MG/ML INJECTION SOLUTION
1.0000 mg | INTRAMUSCULAR | Status: DC
Start: 2019-04-16 — End: 2019-04-17

## 2019-04-16 NOTE — Nurse Focus (Signed)
Pt sleeping in supine position, RR 16, no sings of distress, discomfort present. Call light within reach, bed locked, low position, will continue to monitor patient.
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
## 2019-04-16 NOTE — Plan of Care (Signed)
Pt did fairly well today. She ambulated to the bathroom multiple times with 1-person assistance. When getting to the bathroom, pt has an immediate urge to void and any delay in assistance will result in continence. Dressing behind neck intact with moist serosanguinous drainage. Pt denied numbness/tingling throughout the shift. Per NP, RNs are approved to change the dressing today/this evening. Aspen collar readjusted by Orthotics for comfort. Pt remains compliant with Aspen collar. Pain 4-9/10- PRN Oxy x1 given, atc tylenol/gaba. MRI pending, Ativan ordered as pre-med.

## 2019-04-16 NOTE — Allied Health Progress (Signed)
PM&R--ACUTE CARE SERVICE  PHYSICAL THERAPY TREATMENT NOTE    Date of Service: 04/16/2019     Time in: 1035         04/16/19 1035   Treatment Time/Intention   Document Type therapy note (daily note)   Mode of Treatment individual therapy   Patient/Family Observations Pt rec'd supine in bed, agreeable to therapy although endorses drowsiness.    Patient Effort good   Cognitive Assessment/Intervention   Affect/Mental Status (Cognitive) WNL;flat/blunted affect   Bed Mobility Assessment/Treatment   Bed Mobility Assessment/Treatment bed mobility (all) activities   Rolling Right Independence (Bed Mobility) supervision   Supine-Sit Independence (Bed Mobility) moderate assist (50% patient effort)   Assistive Device (Bed Mobility) bed rails   Comment (Bed Mobility) Mod A to elevate the trunk to sit, pt was with good balance therafter. Pt able to scoot forward with verbal cueing.   Transfer Assessment/Treatment   Transfer Assessment/Treatment sit-stand transfer;stand-sit transfer   Comment (Transfers) CGA to min A for controlled stand/sit. Observablly unstable.   Sit-Stand Independence (Transfers) contact guard;minimum assist (75% patient effort)   Stand-Sit Independence (Transfers) contact guard;minimum assist (75% patient effort)   Systems developer (Sit-Stand Transfers) walker, front-wheeled   Psychologist, prison and probation services (Stand-Sit Transfers) walker, front-wheeled   Gait/Stairs Assessment/Training   Gait/Stairs Assessment/Training gait/ambulation independence;gait/ambulation assistive device;distance ambulated;gait pattern;gait deviations   Independence Level (Gait) supervision;verbal cues   Assistive Device (Gait) walker, front-wheeled   Distance in Feet (Gait) 80' + 20' , sitting rest break in between    Pattern (Gait) 3-point   Deviations/Abnormal Patterns (Gait) base of support, wide;gait speed decreased   Comment (Gait/Stairs) The patient was slightly unstable throughout, however was with no  over loss of balance.    Range of Motion (ROM)   General Range of Motion upper extremity range of motion deficits identified   Comment, General Range of Motion B shoulder flexion to appx 90 degrees, then onset of pain   Left Ankle (Range of Motion)   AROM: Left Ankle Dorsiflexion   (ROM deficit, gastroc contracture. )   Therapeutic Exercise   Lower Extremity (Therapeutic Exercise) gastroc stretch, left;gluteal sets;LAQ (long arc quad), bilateral;marching while seated   Lower Extremity Range of Motion (Therapeutic Exercise)   (NMSK re-education, contract-relax to improve LLE DF)   Comment (Therapeutic Exercise) Pt tolerated manual stretching well    Pain Scale: Numbers Pre/Post-Treatment   Pain Scale: Numbers, Pretreatment 7/10   Pain Scale: Numbers, Post-Treatment 7/10   Pain Location - Orientation posterior   Pain Location neck   Coping   Observed Emotional State accepting;calm   Verbalized Emotional State acceptance   Plan of Care Review   Plan of Care Reviewed With patient   Outcome Summary/Treatment Plan (PT)   Daily Summary of Progress (PT) progress toward functional goals is good   Barriers to Overall Progress (PT) L gastroc contracture/hypertonicity   General Information   Existing Precautions/Restrictions   (Aspen collar on at all times)     Pt required verbal cueing to remember her spine precautions, and to don cervical spine collar at all times.    A: The patient is making good progress towards her mobility goals. Pt reports that her gait is improving, especially with her left ankle wrapped in an ace bandage in supported dorsiflexion for swing limb clearance. Gait training provided to improve and increase flexion pattern of the advancing limb. Pt participated in seated therapeutic exercises in the rehab gym, requiring a rest  break in between sets due to fatigue. The patient reported pain to be the same pre and post treatment.  She continues to be deconditioned and her left gastroc contracture is limiting  her mobility.  She may benefit from an AFO. The patient was encouraged and agreeable to sit up in the bedside chair at the end of session. RN updated.       P: Continue to increase gait endurance/distance, therapeutic exercises, bed mobility and safe transfers using the FWW.       Electronically Signed by:  Abelino DerrickSherry Neera Teng, PT, DPT  Physical Therapist II  Physical Medicine & Rehabilitation Therapies Department  Vocera: [phone]  PI # [phone]

## 2019-04-16 NOTE — Plan of Care (Signed)
Pt AO x 4, ambulated to bathroom x 2 w/ FWW SBA x 1 person. On RA. VSS, complains of pain in back/shoulders at 6/10. Oxy PRN given with subsequent relief of pain. Pt slept between care and was assisted to turn in bed as needed. Fluids encouraged.  No complains of SOB/dizziness. Pt was bladder scanned after each void.    Problem: Patient Care Overview  Goal: Plan of Care Review  Outcome: Ongoing (interventions implemented as appropriate)  Goal: Individualization and Mutuality  Outcome: Ongoing (interventions implemented as appropriate)  Goal: Discharge Needs Assessment  Outcome: Ongoing (interventions implemented as appropriate)  Goal: Interprofessional Rounds/Family Conf  Outcome: Ongoing (interventions implemented as appropriate)     Problem: Pain, Acute (Adult)  Goal: Identify Related Risk Factors and Signs and Symptoms  Description: Related risk factors and signs and symptoms are identified upon initiation of Human Response Clinical Practice Guideline (CPG).  Outcome: Ongoing (interventions implemented as appropriate)  Goal: Acceptable Pain Control/Comfort Level  Description: Patient will demonstrate the desired outcomes by discharge/transition of care.  Outcome: Ongoing (interventions implemented as appropriate)     Problem: Mobility, Physical Impaired (Adult)  Goal: Identify Related Risk Factors and Signs and Symptoms  Description: Related risk factors and signs and symptoms are identified upon initiation of Human Response Clinical Practice Guideline (CPG).  Outcome: Ongoing (interventions implemented as appropriate)  Goal: Enhanced Mobility Skills  Description: Patient will demonstrate the desired outcomes by discharge/transition of care.  Outcome: Ongoing (interventions implemented as appropriate)  Flowsheets (Taken 04/16/2019 0415)  Enhanced Mobility Skills: making progress toward outcome  Goal: Enhanced Functional Ability  Description: Patient will demonstrate the desired outcomes by discharge/transition of  care.  Outcome: Ongoing (interventions implemented as appropriate)  Flowsheets (Taken 04/16/2019 0415)  Enhanced Functional Ability: making progress toward outcome     Problem: Fall Risk (Adult)  Goal: Identify Related Risk Factors and Signs and Symptoms  Description: Related risk factors and signs and symptoms are identified upon initiation of Human Response Clinical Practice Guideline (CPG).  Outcome: Ongoing (interventions implemented as appropriate)  Goal: Absence of Fall  Description: Patient will demonstrate the desired outcomes by discharge/transition of care.  Outcome: Ongoing (interventions implemented as appropriate)  Flowsheets (Taken 04/16/2019 0415)  Absence of Fall: making progress toward outcome     Problem: Nutrition, Imbalanced: Inadequate Oral Intake (Adult)  Goal: Improved Oral Intake  Description: Patient will demonstrate the desired outcomes by discharge/transition of care.  Outcome: Ongoing (interventions implemented as appropriate)  Goal: Prevent Further Weight Loss  Description: Patient will demonstrate the desired outcomes by discharge/transition of care.  Outcome: Ongoing (interventions implemented as appropriate)  Flowsheets (Taken 04/16/2019 0415)  Prevent Further Weight Loss: making progress toward outcome
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
## 2019-04-16 NOTE — Nurse Assessment (Signed)
ASSESSMENT NOTE    Note Started: 04/16/2019, 19:32     Initial assessment completed and recorded in EMR.  Report received from day shift nurse and orders reviewed. Plan of Care reviewed and appropriate, discussed with patient. Pt AO x 4, lying sides ways, moving all extremities. No complains of pain, discomfort, SOB. Dressing with serosanguineous drainage.  Bed locked, low position, call light within reach. Will continue to monitor.  Ladell Pier, RN

## 2019-04-16 NOTE — Plan of Care (Signed)
Problem: Nutrition, Imbalanced: Inadequate Oral Intake (Adult)  Goal: Improved Oral Intake  Description: Patient will demonstrate the desired outcomes by discharge/transition of care.  Outcome: Ongoing (interventions implemented as appropriate)  Flowsheets (Taken 04/16/2019 1405)  Improved Oral Intake: making progress toward outcome     Problem: Nutrition, Imbalanced: Inadequate Oral Intake (Adult)  Goal: Prevent Further Weight Loss  Description: Patient will demonstrate the desired outcomes by discharge/transition of care.  Outcome: Ongoing (interventions implemented as appropriate)  Flowsheets (Taken 04/16/2019 1405)  Prevent Further Weight Loss: making progress toward outcome     PO intake has improved. Will continue to follow. Please refer to last Dietitian note filed under [employer] for additional information.

## 2019-04-17 ENCOUNTER — Other Ambulatory Visit: Payer: Self-pay

## 2019-04-17 LAB — CBC NO DIFFERENTIAL
Hematocrit: 31.9 % — ABNORMAL LOW (ref 36.0–46.0)
Hemoglobin: 10.6 g/dL — ABNORMAL LOW (ref 12.0–16.0)
MCH: 32.2 pg (ref 27.0–33.0)
MCHC: 33.1 % (ref 32.0–36.0)
MCV: 97.3 fL (ref 80.0–100.0)
MPV: 9.4 fL (ref 6.8–10.0)
Platelet Count: 311 10*3/uL (ref 130–400)
RDW: 13.5 % (ref 0.0–14.7)
Red Blood Cell Count: 3.28 10*6/uL — ABNORMAL LOW (ref 4.00–5.20)
White Blood Cell Count: 11.8 10*3/uL — ABNORMAL HIGH (ref 4.5–11.0)

## 2019-04-17 LAB — BASIC METABOLIC PANEL
Calcium: 9.5 mg/dL (ref 8.6–10.5)
Carbon Dioxide Total: 29 mmol/L (ref 24–32)
Chloride: 106 mmol/L (ref 95–110)
Creatinine Serum: 0.76 mg/dL (ref 0.44–1.27)
E-GFR Creatinine (Female): 86 mL/min/{1.73_m2}
E-GFR, African American (Female): 99 mL/min/{1.73_m2}
Glucose: 95 mg/dL (ref 70–99)
Potassium: 3.8 mmol/L (ref 3.3–5.0)
Sodium: 143 mmol/L (ref 135–145)
Urea Nitrogen, Blood (BUN): 19 mg/dL (ref 8–22)

## 2019-04-17 LAB — PHOSPHORUS (PO4): Phosphorus (PO4): 3.1 mg/dL (ref 2.4–5.0)

## 2019-04-17 LAB — MAGNESIUM (MG): Magnesium (Mg): 1.8 mg/dL (ref 1.5–2.6)

## 2019-04-17 MED ORDER — OXYCODONE 5 MG TABLET
5.0000 mg | ORAL_TABLET | ORAL | 0 refills | Status: AC | PRN
Start: 2019-04-17 — End: 2019-04-22

## 2019-04-17 MED ORDER — DIAZEPAM 5 MG TABLET
2.5000 mg | ORAL_TABLET | Freq: Four times a day (QID) | ORAL | 0 refills | Status: AC | PRN
Start: 2019-04-17 — End: 2019-04-25

## 2019-04-17 MED ORDER — CLONAZEPAM 0.5 MG TABLET
0.5000 mg | ORAL_TABLET | Freq: Two times a day (BID) | ORAL | 0 refills | Status: DC
Start: 2019-04-17 — End: 2019-04-17

## 2019-04-17 MED ORDER — OXYBUTYNIN CHLORIDE 5 MG TABLET
5.0000 mg | ORAL_TABLET | Freq: Three times a day (TID) | ORAL | Status: DC
Start: 2019-04-17 — End: 2019-04-17
  Filled 2019-04-17 (×2): qty 1, fill #0

## 2019-04-17 NOTE — Nurse Discharge Note (Signed)
NURSE PROGRESS NOTE    Note Started: 04/17/2019, 13:31     Discharge      Data: Pt deemed medically stable for discharge by team. Received orders to discharge pt to [facility]    Action: Performed pt teaching including: S/S of complications; med instruction; follow-up appointments. PIVs removed from R hand and R foot w/tip intact. Assisted pt w/packing belongings. Report given to receiving nurse at [facility].    Response: Pt verbalized understanding. Departed floor via gurney escorted by [facility] w/all belongings, AHS/CCD, prescriptions. Care plan resolved.    Barnie Alderman, RN
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
## 2019-04-17 NOTE — Discharge Planning (AHS/AVS) (Signed)
You have been accepted at the following skilled nursing facility for your continued rehabilitation:  [facility]  Address: [address] [place], [place] [address]  Phone: [phone]    Transportation has been arranged via [facility] [phone].    If you have any questions or concerns related to the services listed above that were coordinated for your discharge, please contact [facility] Clinical Case Management  [phone].

## 2019-04-17 NOTE — SNF Discharge (Signed)
SKILLED NURSING FACILITY ADMIT ORDERS  Date: 04/17/2019   Time: 12:17       PATIENT: Rhonda Horton  MRN:  [id number]  DOB:  [birth year]    1. Admitted to Inpatient Date: 04/11/19      Discharged to SNF on: 04/17/2019     2. Primary Diagnosis: 5/4: C3-7 laminopasty by Dr. Heron Sabins    3. Secondary Diagnoses:   Past Medical History:   Diagnosis Date    Arthritis     Balance problem     H/O shortness of breath     Hypertension     pt denied, but pt taking HTN meds    Psychiatric illness     pt denied pschye illness, but pt is taking multiple psyche meds, pt claimed it's for nervous breakdown      4. Rehabilitation Potential: good    5. CODE STATUS: Full  Supportive documentation provided in (progress notes/DC summary/H&P)    6. Hospice: no    7. The patient has the capacity to understand and make informed decisions:   Yes    8. Allergies: No Known Allergies    9. Isolation type / location (if blank, no isolation needed):       10. Diet:   PO -  regular      11. Activity restriction:       Cervical collar for all times for 4 to 6 weeks. Philly collar for shower.    12. Vital signs:   Routine per SNF    13. Weight: Weight: 97.4 kg (214 lb 12.8 oz) (04/11/19 2101)   Routine on admission, then monthly.    14. TB Screen:   PPD done - no Date: N/a  CXR done - yes Date: 04/12/2019    15. Wound care:   May change dressing 3 days after discharge.   o If there is no drainage, then can leave the wound open to air.   o If there is drainage, may change dressing daily wound is dry.   o Leave white steri-strips in place. OK to shower with them on.They will fall off on their own. If they are still present at 2 weeks, gently peel them off.   o   16. Bowel care:   Routine per SNF    17. Foley in place?: no   If present, routine foley care PRN.    18. Scheduled follow up appointments:   4 week [facility], appointment pending    19. Recommended follow up appointments:  [facility]  [address],  [address]  [place] [address]  [phone]  In 4 weeks  Post Hospital follow up s/p spine surgery    Patient, No Pcp Per      Post Hospital follow up, establish care with PCP     Other: Patient to establish care with PCP    [facility] Neurology referral for outpatient follow up, pending appointment    20. Dialysis:   No    21. Therapy Orders to be initiated:   Physical Therapy, Occupational Therapy and Incentive Spirometry - (for 6 weeks)    22. Additional Interventions:  Anticoagulants:  Type: prophylaxis   End Date: 04/27/2019   INR goal: N/A   Next INR: N/A or until patient is fully ambulatory    BP meds: Amlodipine  once daily and HCTZ  once daily held, and may resume at the discretion of MD at SNF  Metoprolol parameters, Hold for  SBP<120 and/or HR<60, or at the discretion of MD at [facility]    Social Worker support for living situation post SNF discharge    23. Medications and treatments:   Rhonda Horton, Rhonda Horton   Home Medication Instructions [id number]    Printed on:04/17/19 1239   Medication Information                      Acetaminophen (TYLENOL) 500 mg Tablet  Take 2 tablets by mouth every 6 hours.             Artificial Tears (LIQUIFILM TEARS) 1.4 % ophthalmic solution  Instill 1 drop into the RIGHT eye every 4 hours if needed.             Atorvastatin (LIPITOR) 20 mg Tablet  Take 20 mg by mouth every day.             BuPROPion (WELLBUTRIN XL) 150 mg XL tablet  Take 150 mg by mouth every morning. Takes with 300 mg for 450 mg XL daily                       BuPROPion (WELLBUTRIN XL) 300 mg XL tablet  Take 300 mg by mouth every morning. Takes with 150 mg for total of 450 mg XL daily                       Cholecalciferol, Vitamin D3, (VITAMIN D-3) 10 mcg (400 unit) Capsule  Take 1 tablet by mouth every day.             Diazepam (VALIUM) 5 mg Tablet  Take 0.5-1 tablets by mouth every 6 hours if needed (spasm pain).             Docusate (COLACE) 100 mg Capsule  Take 1 capsule by mouth 2 times daily.  Hold for loose stools.             Enoxaparin (LOVENOX) 40 mg/0.4 mL Syringe  Inject 40 mg subcutaneously every 24 hours. End on 04/27/19, or until patient is fully ambulatory for DVT prophylaxis             Erythromycin 0.5% Ophthalmic Ointment  Instill 0.5 inches into the RIGHT eye 4 times daily. End 04/23/2019             Escitalopram (LEXAPRO) 20 mg tablet  Take 2 tablets by mouth every morning.             Gabapentin (NEURONTIN) 400 mg Capsule  Take 1 capsule by mouth 3 times daily.             LevoTHYROxine 137 mcg tablet  Take 137 mcg by mouth every morning before a meal.             MECLIZINE HCL, BULK, MISC  Take 12.5 mg by mouth two times daily if needed.             Melatonin 3 mg Tablet  Take 1 tablet by mouth every day at bedtime if needed for insomnia.             Metoprolol Tartrate (LOPRESSOR) 50 mg Tablet  Take 50 mg by mouth 2 times daily.             Naloxone (NARCAN) 4 mg/actuation Nasal Spray  Use for opioid overdose. Give 1 spray in nostril. If no response / breathing slows down, repeat every 2 minutes until emergency help  arrives.             Nicotine (NICODERM CQ) 7 mg/24 hr Patch  Apply 1 patch to the skin every 24 hours.             Omeprazole (PRILOSEC) 20 mg Delayed Release Capsule  Take 20 mg by mouth once daily before a meal.             Oxybutynin (DITROPAN) 5 mg Tablet  Take 1 tablet by mouth 3 times daily.             Oxycodone (ROXICODONE) 5 mg Tablet  Take 1-3 tablets by mouth every 3 hours if needed.             Polyethylene Glycol 3350 (MIRALAX) 17 gram Powder  Take 1 packet by mouth two times daily if needed (constipation). Mix in 4 to 8 oz of water, soda, coffee, juice or tea.             Sennosides (SENOKOT) 8.6 mg Tablet  Take 2 tablets by mouth every day at bedtime.             Topiramate (TOPAMAX) 50 mg tablet  Take 50 mg by mouth 2 times daily.               Note: For each psychotropic medication listed, the patient has capacity or informed consent has been obtained for use  for the following diagnosis s/p C3-7 laminopasty.    24. Does this patient have a history of CHF?  No    Alben SpittleMartha Elridge Stemm,FNP-BC  Nurse Practitioner

## 2019-04-17 NOTE — Clinical Case Management (Addendum)
Clinical Case Management Assessments    Name: Rhonda Horton  MRN: [id number]   Date of Birth: 1956-03-20 (31yr) Gender: female    Note Date: 04/17/2019 Note Time: 11:09       FINAL DISPOSITION NOTE    Permanent Address: [address]  [place] [address]    Discharge Disposition: Skilled nursing facility  Medically stable for discharge/transfer per : Mila Homer, MD  Level of Care: Other (SNF)  Chart copied?: Yes  D/C Summary: Yes  Ambulance RX: Yes  Admit Orders: Yes  Above documentation sent with patient?: Yes     Patient discharged to:   Has facility accepted the patient?: Yes  Facility name: [facility]  Ph: [phone]     [address], [place], [place] [address]  Facility phone number: [phone]  Facility fax number: [phone]  Name of accepting MD: Dr. Esmeralda Arthur  Phone number for nurse to call report: [phone] [facility]     Patient/patient representative, informed of discharge disposition: yes  Patient/family offered choice of provider and agreeable with plan/referrals? yes        Funding/Billing: Payor: MEDICARE RISK / Plan: [employer] HMO / [employer] / [facility] [facility] [place] [facility]     Patient can follow-up with:   PCP: Patient, No Pcp Per  /  Phone Number: None  Preferred Pharmacy:      Transportation:   Transportation needed?: Yes  Mode of transportation: Ambulance  Patient alert and oriented for discharge transportation?: Yes  Mode of transportation is appropriate?: Yes  Demographic information confirmed with patient or responsible party?: Yes  Transport company name: [facility]  Telephone Number: [phone]  Level of transportation: BLS     Comments:   Patient is stable for transfer to a skilled nursing facility for continued rehabilitation. Facility is aware and they confirmed acceptance.    SNF Orders and DC Summary is pending at this time.  Chart copy ordered.  Transportation has been arranged  and pick up scheduled @ 1PM.    Team notified.    Date/Time:04/17/2019 11:09    ADDENDUM: 04/17/2019    SNF Orders and DC Summary were provided to the facility.    Electronically Signed By:    Jed Limerick, BSN, RN  Clinical Case Manager  Department of Clinical Case Management  Tel. No. [phone]  Pager No. [phone]  E-Mail: [email or web address]

## 2019-04-17 NOTE — Clinical Case Management (Signed)
Clinical Case Management Assessments    Name: Rhonda Horton  MRN: [id number]   Date of Birth: 05-16-1956 (61yr) Gender: female    Note Date: 04/17/2019 Note Time: 10:43         ONGOING DISCHARGE PLANNING NOTE    Current functional level of care - PT (05/08)  Bed Mobility Assessment/Treatment   Bed Mobility Assessment/Treatment rolling right;sidelying-sit;sit-supine   Rolling Right Independence (Bed Mobility) minimum assist (75% patient effort)  (with HOB at 30 degrees for log roll training)   Sidelying-Sit Independence (Bed Mobility) minimum assist (75% patient effort)  (to trunk for log roll from 30-deg HOB)   Assistive Device (Bed Mobility) bed rails   Comment (Bed Mobility) commenced log roll training with use of bed mechanics, needs continue work   Transfer Assessment/Treatment   Sit-Stand Independence (Transfers) contact guard  (for balance protection, needs to lean forward more)   Stand-Sit Independence (Transfers) supervision   Systems developer (Sit-Stand Transfers) walker, front-wheeled   Psychologist, prison and probation services (Stand-Sit Transfers) walker, front-wheeled   Gait/Stairs Assessment/Training   Independence Level (Gait) supervision   Assistive Device (Gait) walker, front-wheeled   Distance in Feet (Gait) 80   Pattern (Gait) step-through   Deviations/Abnormal Patterns (Gait) cadence decreased;gait speed decreased  (occasional pause of progression, walker propulsion hard)   Comment (Gait/Stairs) steadier legs today, still needs FWW for trunk unsteadiness     Anticipated Lines/Drains/Wounds: None  Anticipated Home Infusion: None  Anticipated home health care services: None  Anticipated DME: will defer to SNF  Anticipated Barriers to Discharge: None    Patient/Family offered choice of provider and agreeable with plan/referrals? yes     Comments:   CM went to see patient at bedside to provide her SNF Choice Letter. Informed her about [facility] in [place], and the facility has  accepted for admission. Patient agreed with [facility], said that she has been at this facility when she had her back surgery.    @10 :10 - received a call from the facility that they received insurance authorization.    CM will initiate coordination of transfer with the facility.  Team notified.    Plan/Follow-up needed: Discharge to SNF.    Date/Time: 04/17/2019 10:43  Electronically Signed By:    Jed Limerick, BSN, RN  Clinical Case Manager  Department of Clinical Case Management  Tel. No. [phone]  Pager No. [phone]  E-Mail: [email or web address]

## 2019-04-17 NOTE — Plan of Care (Signed)
Pt AO x 4, ambulated with FWW SBA to toilet 2 times during this shift. Pain increased with mobility, Oxy 15 g PRN given with positive therapeutic effect. Good UOP and appetite.  Reposition herself in bed independently, but requires  assistant when getting out of bed. Spinal precautions maintained and Aspen collar on at all times. Dressing was changed. MRI still pending. Will continue to monitor patient.    Problem: Patient Care Overview  Goal: Plan of Care Review  Outcome: Ongoing (interventions implemented as appropriate)  Goal: Individualization and Mutuality  Outcome: Ongoing (interventions implemented as appropriate)  Goal: Discharge Needs Assessment  Outcome: Ongoing (interventions implemented as appropriate)  Goal: Interprofessional Rounds/Family Conf  Outcome: Ongoing (interventions implemented as appropriate)     Problem: Pain, Acute (Adult)  Goal: Identify Related Risk Factors and Signs and Symptoms  Description: Related risk factors and signs and symptoms are identified upon initiation of Human Response Clinical Practice Guideline (CPG).  Outcome: Ongoing (interventions implemented as appropriate)  Goal: Acceptable Pain Control/Comfort Level  Description: Patient will demonstrate the desired outcomes by discharge/transition of care.  Outcome: Ongoing (interventions implemented as appropriate)     Problem: Mobility, Physical Impaired (Adult)  Goal: Identify Related Risk Factors and Signs and Symptoms  Description: Related risk factors and signs and symptoms are identified upon initiation of Human Response Clinical Practice Guideline (CPG).  Outcome: Ongoing (interventions implemented as appropriate)  Goal: Enhanced Mobility Skills  Description: Patient will demonstrate the desired outcomes by discharge/transition of care.  Outcome: Ongoing (interventions implemented as appropriate)  Goal: Enhanced Functional Ability  Description: Patient will demonstrate the desired outcomes by discharge/transition of  care.  Outcome: Ongoing (interventions implemented as appropriate)     Problem: Fall Risk (Adult)  Goal: Identify Related Risk Factors and Signs and Symptoms  Description: Related risk factors and signs and symptoms are identified upon initiation of Human Response Clinical Practice Guideline (CPG).  Outcome: Ongoing (interventions implemented as appropriate)  Goal: Absence of Fall  Description: Patient will demonstrate the desired outcomes by discharge/transition of care.  Outcome: Ongoing (interventions implemented as appropriate)  Flowsheets (Taken 04/17/2019 0437)  Absence of Fall: making progress toward outcome     Problem: Nutrition, Imbalanced: Inadequate Oral Intake (Adult)  Goal: Improved Oral Intake  Description: Patient will demonstrate the desired outcomes by discharge/transition of care.  Outcome: Ongoing (interventions implemented as appropriate)  Goal: Prevent Further Weight Loss  Description: Patient will demonstrate the desired outcomes by discharge/transition of care.  Outcome: Ongoing (interventions implemented as appropriate)  Flowsheets (Taken 04/17/2019 0437)  Prevent Further Weight Loss: making progress toward outcome     Problem: Skin Injury Risk (Adult)  Goal: Identify Related Risk Factors and Signs and Symptoms  Description: Related risk factors and signs and symptoms are identified upon initiation of Human Response Clinical Practice Guideline (CPG).  Outcome: Ongoing (interventions implemented as appropriate)  Goal: Skin Health and Integrity  Description: Patient will demonstrate the desired outcomes by discharge/transition of care.  Outcome: Ongoing (interventions implemented as appropriate)  Flowsheets (Taken 04/17/2019 0437)  Skin Health and Integrity: making progress toward outcome

## 2019-04-17 NOTE — Allied Health Progress (Signed)
PM&R--ACUTE CARE SERVICE  PHYSICAL THERAPY TREATMENT NOTE    Date of Service: 04/17/2019     Treatment Day 4 of 10 in reporting period.    Time in: 0905      S: doing "so-so" via wave of hand      O:      04/17/19 0905   Treatment Time/Intention   Discipline physical therapy assistant   Document Type therapy note (daily note)   Mode of Treatment individual therapy   Patient/Family Observations Pt says going to snf near [place] where she lives. Agreeable for treatment. Has been doing LE therex and calf stretching.   Care Plan Review care plan/treatment goals reviewed   Total Minutes, Physical Therapy Treatment 20   Patient Effort good   Treatment Considerations/Comments Supine with HOB up at 30 degress, c collar in place.   Patient Response to Treatment positive   Cognitive Assessment/Intervention   Affect/Mental Status (Cognitive) WFL   Bed Mobility Assessment/Treatment   Bed Mobility Assessment/Treatment rolling right;sidelying-sit;sit-supine   Rolling Right Independence (Bed Mobility) minimum assist (75% patient effort)  (with HOB at 30 degrees for log roll training)   Sidelying-Sit Independence (Bed Mobility) minimum assist (75% patient effort)  (to trunk for log roll from 30-deg HOB)   Assistive Device (Bed Mobility) bed rails   Comment (Bed Mobility) commenced log roll training with use of bed mechanics, needs continue work   Technical sales engineer Independence (Transfers) contact guard  (for balance protection, needs to lean forward more)   Stand-Sit Independence (Transfers) supervision   Systems developer (Sit-Stand Transfers) walker, front-wheeled   Psychologist, prison and probation services (Stand-Sit Transfers) walker, front-wheeled   Gait/Stairs Assessment/Training   Independence Level (Gait) supervision   Assistive Device (Gait) walker, front-wheeled   Distance in Feet (Gait) 80   Pattern (Gait) step-through   Deviations/Abnormal Patterns (Gait) cadence decreased;gait speed  decreased  (occasional pause of progression, walker propulsion hard)   Comment (Gait/Stairs) steadier legs today, still needs FWW for trunk unsteadiness   Motor Skills Assessment/Interventions   Additional Documentation Balance Interventions (Group)   Therapeutic Exercise   Lower Extremity (Therapeutic Exercise) gastroc stretch, left;gastroc stretch, right  (manual stretching in supine by me, still PF contractures)   Balance Interventions   Standing, Static (Balance) good balance  (at sink)   Training Strategies (Balance) 5' ADL standing in restroom in front of mirror, SBA for balance, no loss of support or balance, cues for keeping shoulder elevation under 90 degress during hair care.   Pain Scale: Numbers Pre/Post-Treatment   Pain Scale: Numbers, Pretreatment 4/10   Pain Scale: Numbers, Post-Treatment 6/10   Pain Location neck  (and shoulders)   Outcome Summary/Treatment Plan (PT)   Daily Summary of Progress (PT) progress toward functional goals is good   Plan for Continued Treatment (PT) continue with log roll (wean from bed features), transfer/gait training, calf stretching   Anticipated Discharge Disposition (PT) skilled nursing facility  (per notes)   General Information   Existing Precautions/Restrictions   (collar ATC)     After bout of gait and short bedside seated rest, performs standing ADLs sink-side x 4', fairly steady.    RN assist to scoot patient to head of bed     Skilled treatment rendered during above session including cues, guarding, patient education and functional training as necessary.    Patient continues supine in bed after treatment, call button in reach, report to RN re above.  A: progressing, but lacking sufficient steadiness/ independence to go home yet. Unable to get self out-of-bed even with bed features, and gait deficient re goal.      P: Continue with plan of care towards goals as noted in flowsheet above.       Electronically Signed by:  Leslye Peerobert Herta Hink PTA  PI# [id number]

## 2019-04-17 NOTE — Nurse Assessment (Signed)
ASSESSMENT NOTE    Note Started: 04/17/2019, 07:28     Initial assessment completed and recorded in EMR.  Report received from night shift nurse and orders reviewed. Plan of Care reviewed and appropriate, discussed with patient.
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
## 2019-04-17 NOTE — Discharge Instructions (Signed)
NEW MEDICATIONS:   It is normal to have some discomfort or pain at the surgical site during activity and at night for a few weeks.   o Take pain medications as needed, and do not take it if you are drowsy or in no pain.    Using an icepack for 10 to 15 minutes may relieve pain at the surgical site.    Take Multivitamin with Vitamin D and Calcium daily for the next year     Do not use NSAIDS, such as Motrin, until cleared by Dr. Rosana HoesJavidan    You are being discharged on narcotic pain medication Oxycodone, take as needed and as directed.  You recently received a script for Norco tablets (Hydrocodone/Acetaminophen). Do not take Norco tablets and Oxycodone together, instead take Oxycodone for postoperative pain and when you complete your prescribed amount, you may resume Norco tablets as previously prescribed.      Do not take pain medications if you are not having pain or if you become too sleepy.  If you become too sleepy or have a difficult time taking a deep breath, take the intranasal dose of narcan and if it does not wake you up, you will need to call 911 to be taken to the nearest ER. Narcan nasal spray helps reverse the effects of narcotic opiate pain medications and a loved one or caregiver at home can also administer it.    You may have been prescribed a pain medicine(s) that contains Tylenol (acetominophen). If you are taking other Tylenol containing medicines at home, be sure NOT to exceed 4 gram's (4000 milligrams) of Tylenol per day.     Please do not mix any medication with alcohol or drugs. This may inhibit the action of medication and may harm you!          DISCHARGE INSTRUCTIONS  1. Wound Care:    May change dressing 3 days after discharge.   o If there is no drainage, then you can leave the wound open to air.   o If there is drainage, may change dressing daily wound is dry.   *Prior to changing the dressing, be sure to wash your hands well with soap & water.   o Do not clean the incision with  anything unless your doctor told you.    Leave white steri-strips in place. OK to shower with them on.They will fall off on their own. If they are still present at 2 weeks, gently peel them off.   Monitor for signs and symptoms of infection    Temp greater than 100.5 or persistent chills     Redness, swelling warmth around the incisionor drainage from incision     Persistent nausea or vomiting and inablility to tolerate fluids     Pus from wound sites     Any other concerning symptoms or signs   Proceed immediately to the ER or call [phone] (spine clinic)    2. Activity:   No bending, no twisting, no lifting > 10lbs    Cervical collar for all times or for comfort. [place] collar for shower.   Walk a half an hour every day, gradually increase as tolerated.    Do not sit in a recliner or soft low sofa until you are strong and able to get up and out of bed and arm chairs independently.    Bathing: you may shower when wound is dry. Do not soak wound (e.g. Baths, swimming, hot tubs, etc). If your incision gets wet,  be sure to PAT DRY, or use a blow dryer on COOL setting to air dry, but DO NOT RUB.     If you have received a brace during your stay at [facility], and would like to be seen by an Orthotist for further instruction or an adjustment, please call [phone] to speak to an Orthotist.    If you develop the following symptoms...   Numbness/tingling in extremities   New or increasing weakness in extremities    Difficulty speaking or swallowing    Loss of Bowel or bladder control   Burning or pain on urination    Calf swelling or tenderness, shortness of breath, or chest pain/tightness    Any other worsening sign or symptom  .Marland KitchenMarland Kitchenproceed immediately to the ER or call the Spine Clinic @ [phone]    FOLLOW UP:  Please call the spine clinic at [phone] to make an appointment for 4 weeks after discharge. You may also call the spine clinic at [phone] during business hours with any  questions you may have. After hours, you can call the clinic and press '0' OR call the [facility] Operator on [phone] and it will direct you to the main hospital operator who can page the orthopedic on-call resident. He/she will look up the patient's chart on the computer and are usually likely to solve some problems. They may ask you to come to the ER or come to the clinic at the next available appointment.    It is important that you get established with a primary care doctor.   ?  To find a doctor in your area who is taking new Medi-Cal patients:  ?  Go to [email or web address] then select providers and plans, then select find a doctor or medical clinic. You will be able to search by zip code  ?  [place] area Primary / Urgent Care Centers Accepting Straight Medi Cal as of May 2013  ?  [facility] (formally known at [facility]):   Call Center: [phone]  [facility]   [address], [address] [place], [place] [address] [phone]  [facility]  [address] [place], [place] [address] [phone]  [facility]  [address] [place], [place] [address] [phone]  [facility]  [phone] [place], [place] [address] [phone]   ?  [facility]:  All clinics open 8am-5:30pm for appointments only.  [address] [place], [place] [address] [phone]  [address] [place], [place] [address] [phone]  [address], [place], [place], [address] [phone]  [address] [place], [place] [address] [phone]  [address] [place], [place] [address] [phone]  [address], [place], [place]. [address] [phone]  [address], [place], [place], [address], [phone]  [address], [place], [place] [address] [phone]  [address], [place], [place] [address]  [phone]  ?  [facility]:  Does not prescribe pain medication  [facility]  [address], [place] [address] [phone] or [phone]  [address] [place], [place] [address] [phone]  [facility]  [address] [place], [place] [address] [phone]  ?  [facility]  [address] [place], [place] [address] [phone]  M-F 8:30am-12 and 1:30pm - 4:30pm  ?  [facility]  [address] [place], [place] [address] [phone]  M-F 9am-9pm  Weekends and holidays 9am-5pm  Walk in urgent care only, no primary care assignment available  [facility] [phone]  [address] [place], [place] [address]

## 2019-05-06 ENCOUNTER — Other Ambulatory Visit: Payer: Self-pay | Admitting: Orthopaedic Surgery

## 2019-05-06 DIAGNOSIS — Z9889 Other specified postprocedural states: Secondary | ICD-10-CM | POA: Insufficient documentation

## 2019-05-07 ENCOUNTER — Ambulatory Visit
Admission: RE | Admit: 2019-05-07 | Discharge: 2019-05-07 | Disposition: A | Discharge status: Home / Self Care | Payer: Commercial Managed Care - HMO | Source: Ambulatory Visit | Attending: Orthopaedic Surgery | Admitting: Orthopaedic Surgery

## 2019-05-07 ENCOUNTER — Telehealth: Payer: Self-pay | Admitting: Orthopaedic Surgery

## 2019-05-07 ENCOUNTER — Ambulatory Visit (HOSPITAL_BASED_OUTPATIENT_CLINIC_OR_DEPARTMENT_OTHER): Payer: Commercial Managed Care - HMO | Admitting: Orthopaedic Surgery

## 2019-05-07 ENCOUNTER — Encounter: Payer: Self-pay | Admitting: Orthopaedic Surgery

## 2019-05-07 VITALS — BP 95/65 | HR 62 | Temp 98.0°F | Resp 14 | Ht 64.0 in | Wt 216.5 lb

## 2019-05-07 DIAGNOSIS — M4802 Spinal stenosis, cervical region: Secondary | ICD-10-CM

## 2019-05-07 DIAGNOSIS — Z9889 Other specified postprocedural states: Secondary | ICD-10-CM

## 2019-05-07 DIAGNOSIS — R252 Cramp and spasm: Secondary | ICD-10-CM

## 2019-05-07 DIAGNOSIS — Z9181 History of falling: Secondary | ICD-10-CM

## 2019-05-07 NOTE — Progress Notes (Signed)
Orthopaedic Spine Clinic Follow-up Note    Date of visit: 05/07/2019    DOS:  04/13/19 C3-7 laminopasty     PATIENT SUMMARY:   Rhonda Horton is a [age]yr -old Caucasian female here in follow up for C3-7 laminopasty.  As a recall this patient had severe cervical myelopathy prior to decompression.  She was discharged and sent to a skilled nursing facility in a c-collar.  At the skilled nursing facility patient stated that she had a ground-level fall falling onto her butt and not hitting her head against anything.  After her fall she states that she has been having extreme difficulty with ambulation.  The nurse from the nursing home is here with her today states that her lower extremities are very spastic and pointed and she is having difficulty clearing the ground.  Patient states that she still having minimal amounts of neck pain more so on the left than the right.    REVIEW OF SYSTEMS:  There is no report of fever/ chills/ sweats; nausea/ vomiting/ diarrhea or changes in bowel or bladder function.      PHYSICAL EXAMINATION:  BP 95/65 (SITE: left arm, Orthostatic Position: sitting, Cuff Size: large)   Pulse 62   Temp 36.7 C (98 F) (Temporal)   Resp 14   Ht 1.626 m (5\' 4" )   Wt 98.2 kg (216 lb 7.9 oz)   BMI 37.16 kg/m   Constitutional:  Alert, non-ambulatory, NAD.      Psych: Oriented x 4  Mood/Affect pleasant.   Skin:  Posterior cervical incision(s) dry and intact   Musculoskeletal/Neurological:   Ambulation: in wheelchair stating she cannot walk  Patient cannot heel and toe walk, cannot squat and rise  Tandem gait unable to investigate  Romberg: not done  Spine:  Tenderness over posterior c-spine    ROM in neck in collar, limited due to stiffness  Sensation: ILT or pinprick in C-5 to T-1   Reflexes: biceps 3+, BR 3+ , triceps 3+ bilaterally  Hoffman: positive bilaterally     Strength: upper extremities    Movement  Shoulder abduction   elbow flexion   Elbow flexion    wrist extension   Elbow extension     wrist flexion    Finger extension   Finger flexion,   finger  adduction  Finger intrinsics         Root  C5  C6  C7  C8  T1    Nerve  axillary, suprascapular,   musculocutaneous  radial,   musculocutaneous  median,   radial  median, ulnar  median, ulnar    Muscle  deltoid, biceps  biceps radialis  extensor carpi    longus/ brevis  triceps,    flexor carpi    radialis/ ulnaris  FDP   radialis/ulnaris/   superficialis  interossei    dorsal/palmar    Right   5/5   4-/5   4-/5   4+/5   4+/5    Left   4/5   4-/5   4-/5   4+/5   4/5     Strength: Lower Extremities   Movement  Hip    flexion  Knee   flexion  Knee    extension   Ankle    dorsiflexion  Ankle   plantar   flexion   Big toe    extension     Root  L1/ 2/ 3  L5/ S1/ S2  L2/ 3/ 4  L4/ 5  S1/  2  L5/ S1    Nerve  femoral/    spinal n.  sciatic     femoral  deep    peroneal  tibial  deep    peroneal    Muscle  iliopsoas  hamstrings  quads  tibialis   anterior  gastrocnemius ,   soleus  EHL    Right   4+/5   4+/5   4+/5   Patient with baseline spasticity in equinous   5/5   4/5    Left   4+/5   4+/5   4+/5 Patient with baseline spasticity in equinous   5/5   5/5       Pedal pulses : DP: 2+ bilaterally  PT: 2+ bilaterally    IMAGING/ DIAGNOSTIC STUDIES:  Imaging reviewed with the patient today and showed :    X rays:  Stable and intact hardware with no signs of loosening.       IMPRESSION/ RECOMMENDATIONS:  Rhonda Horton presents today in follow up for 3-week follow-up for C3-C7 laminoplasty.  Prior to surgery patient had significant myelopathic symptoms as well as spasticity in her bilateral lower extremities especially knees and ankles consistent requires contracture.  We put her in [facility] boots while she was in the hospital.  The nurses she came in with from the skilled nursing facility today did not witness the fall but states that she is been having difficulty ambulating ever since her fall.  It is unlikely that this is coming from her neck or myelopathic  symptoms that she is not experiencing more pain and she does not have any significant signs of hardware failure.  At this time is likely just a sequela of her significant myelopathy prior to surgery.  She will need to continue working with aggressive physical therapy and occupational therapy to help with her gait and ambulation.    - repeat MRI C-spine  - C-collar when ambulatory   - RTC 3 months     The patient was seen, evaluated and a care plan was developed with Dr. Heron Sabins today.    Follow up in :   3 months  Follow up reason:  Follow up  Surgery Date:  04/13/19  X-rays Needed:  cervical  Spine Pro Needed:  Cervical     Earna Coder R. Loleta Chance, MD PGY3  Orthopaedic Surgery  Personal Pager: [phone]  Consult Pager: [phone]  PI# [id number]      The patient was seen and examined.  I reviewed and agree with the resident's assessment and plan we developed as outlined in the resident's note.  Report electronically signed by Mila Homer, MD. Attending

## 2019-05-07 NOTE — Nursing Note (Signed)
Identified pt using Name and Date Of Birth.Vital signs taken, screened for pain, screen for mobility. Allergies verified. Pharmacy updated. Montana Bryngelson MA II.

## 2019-05-07 NOTE — Telephone Encounter (Signed)
Last seen in the clinic on today by Dr. Heron Sabins, s/p C3-7 laminoplasty on 04/13/19    Notes reviewed no medications d/c.    Spoke to Narvreet at [facility] pt's id's verified.    Stated the nursing assistant  that accompanied the patient was told by Dr. Heron Sabins that Oxycodone, Diazepam have been discontinued.    No mention in the visit note. Since the patient is in Rehab, Spine clinic does not manage pt's medication regimen. [facility] does have an MD on site that oversees the care. Advised to consult facility MD if the above medications will still be needed.    Please send all EMR Responses to our work department pool: [facility]    Kristine Garbe, RN, BSN  [facility]   Main Ph #:   [phone]   Back line #:  [phone]   Fax # :          [phone]

## 2019-05-07 NOTE — Telephone Encounter (Signed)
Narvreet from [facility] nursing care facility is requesting clarification on what medications Dr. Heron Sabins discontinued     She would like a call back at [phone]    Rupert Stacks   [facility]  Direct Line [phone]  Clinic Line [phone]

## 2019-07-29 ENCOUNTER — Other Ambulatory Visit: Payer: Self-pay | Admitting: Orthopaedic Surgery

## 2019-07-29 ENCOUNTER — Telehealth: Payer: Self-pay | Admitting: Orthopaedic Surgery

## 2019-07-29 DIAGNOSIS — Z9889 Other specified postprocedural states: Secondary | ICD-10-CM

## 2019-07-29 NOTE — Telephone Encounter (Signed)
Left message for patient to call our office back.  Pre-planning for patient's next appointment with Dr. Deirdre Peer on 08/05/19.      Whoever answers the phone, could you ask patient if she had her MRI C spine done per her last visit note.  repeat MRI C-spine  - C-collar when ambulatory   - RTC 3 months    If done pt should hand carry her images, if not please reschedule.

## 2019-08-05 ENCOUNTER — Ambulatory Visit (HOSPITAL_BASED_OUTPATIENT_CLINIC_OR_DEPARTMENT_OTHER): Payer: Self-pay | Admitting: Orthopaedic Surgery

## 2019-08-05 DIAGNOSIS — Z008 Encounter for other general examination: Secondary | ICD-10-CM

## 2019-12-18 ENCOUNTER — Encounter: Payer: Self-pay | Admitting: Orthopaedic Surgery

## 2020-07-29 IMAGING — US US BREAST RT LTD
1 series · 10 of 10 positions shown · non-contrast
Comparison: The present examination has been compared to prior imaging studies.

HISTORY: Patient is 63 years old and is seen for diagnostic evaluation of asymmetry in the medial right breast. The patient has no personal history of cancer. The patient does not have a first degree relative with breast cancer.
TECHNIQUE: Right 2-D digital diagnostic mammogram was performed followed by 3-D tomosynthesis. Real-time targeted ultrasound of the right breast was performed.  Current study was also evaluated with a computer aided detection (CAD) system.

[Series 1: us breast right ltd · 10 of 10 slices shown]
[im 1/10]
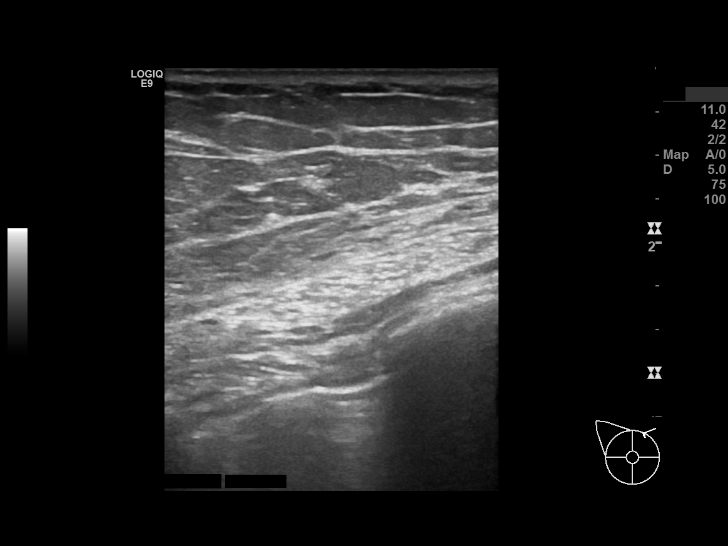
[im 2/10]
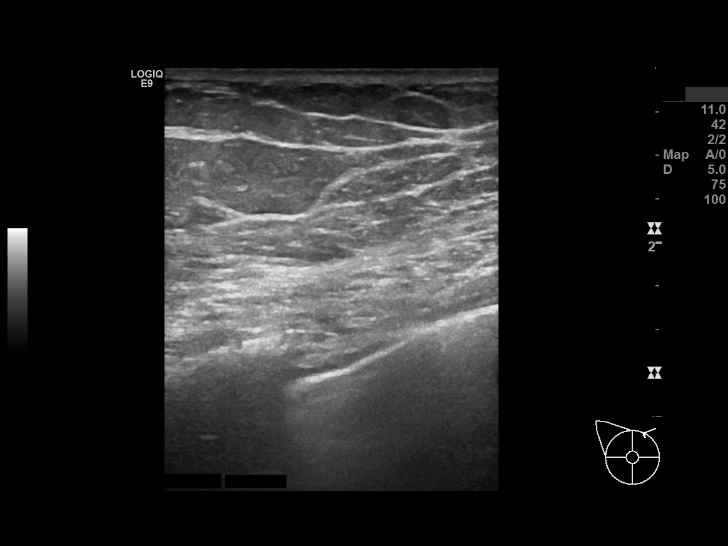
[im 3/10]
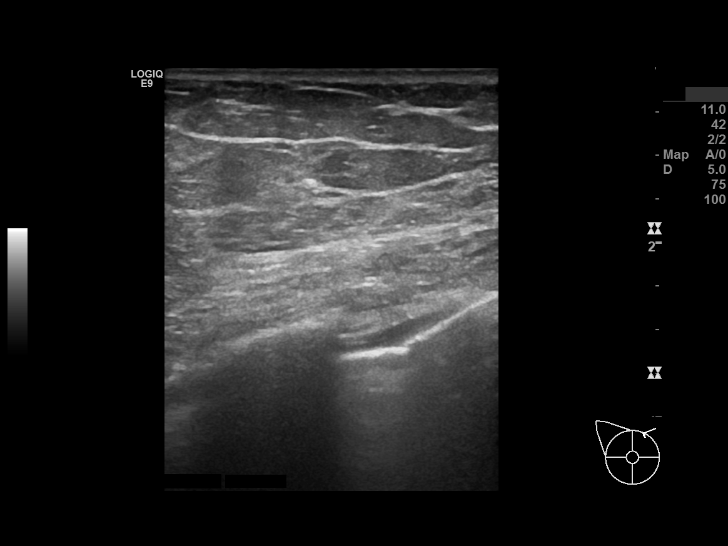
[im 4/10]
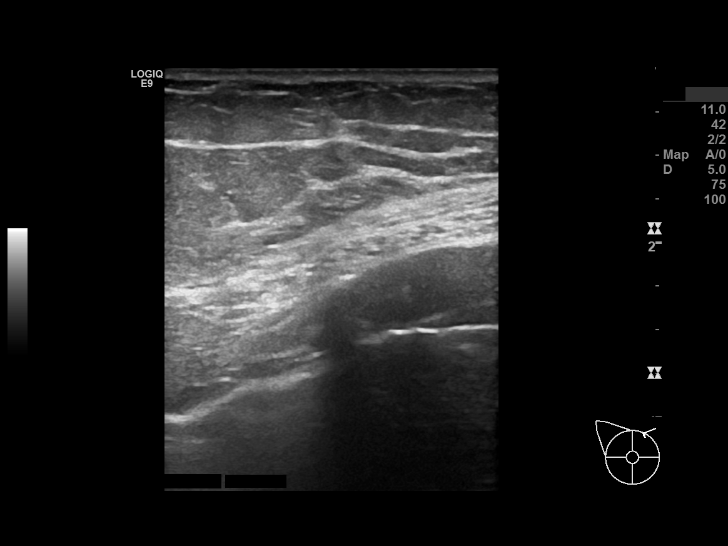
[im 5/10]
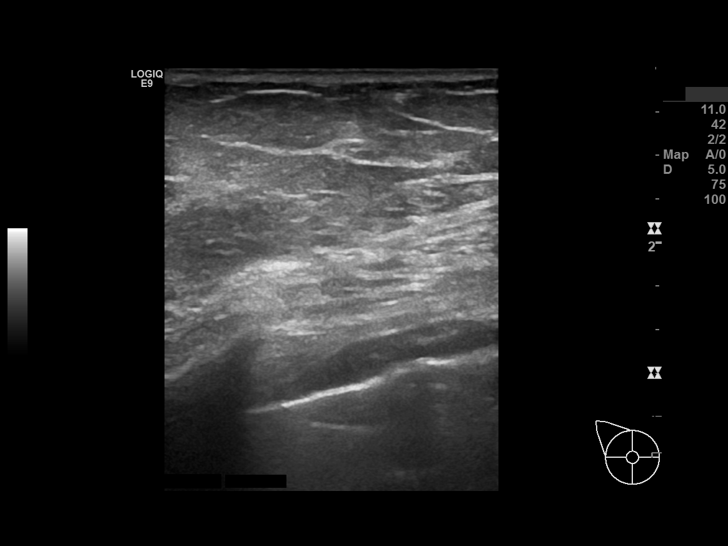
[im 6/10]
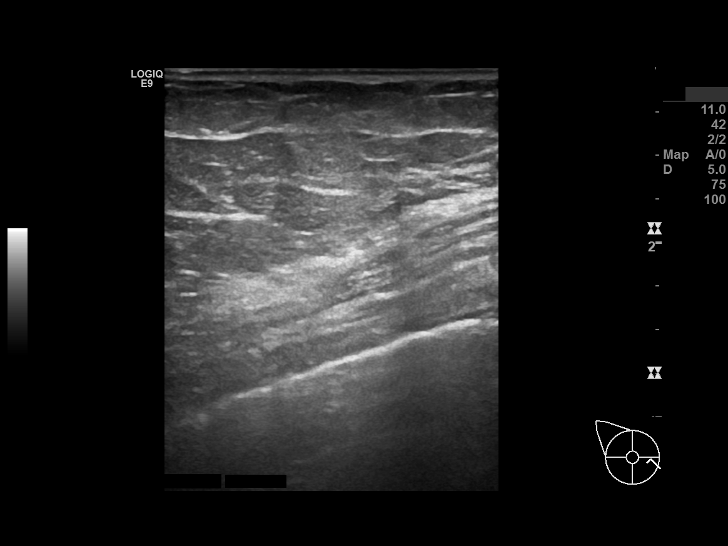
[im 7/10]
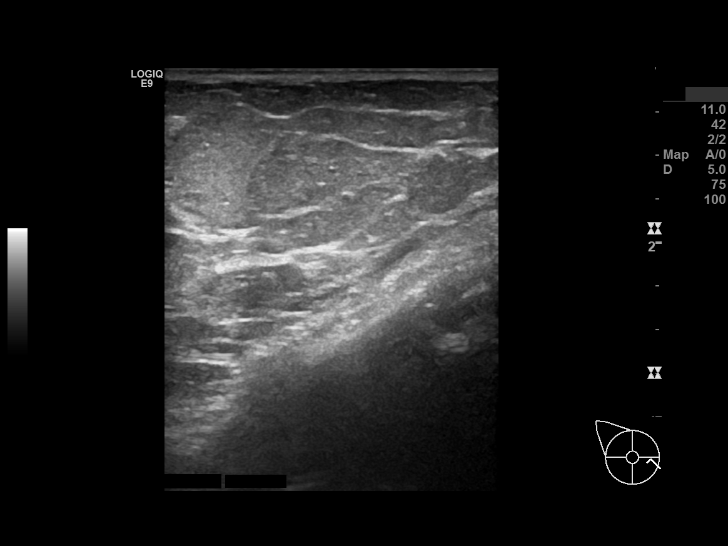
[im 8/10]
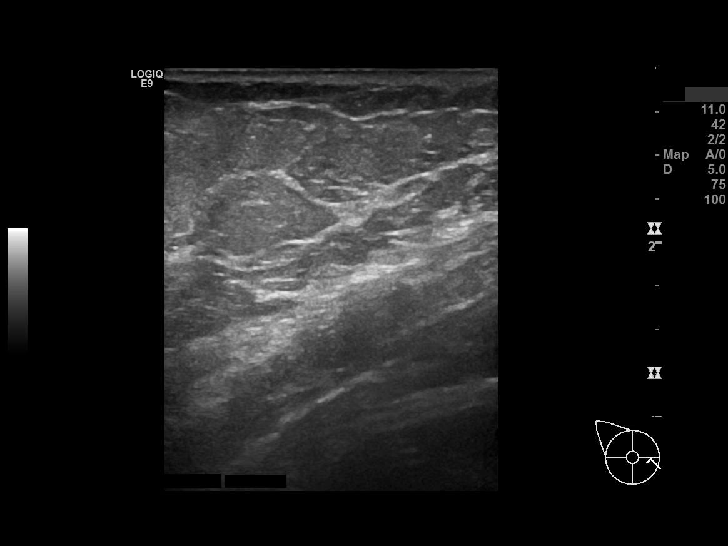
[im 9/10]
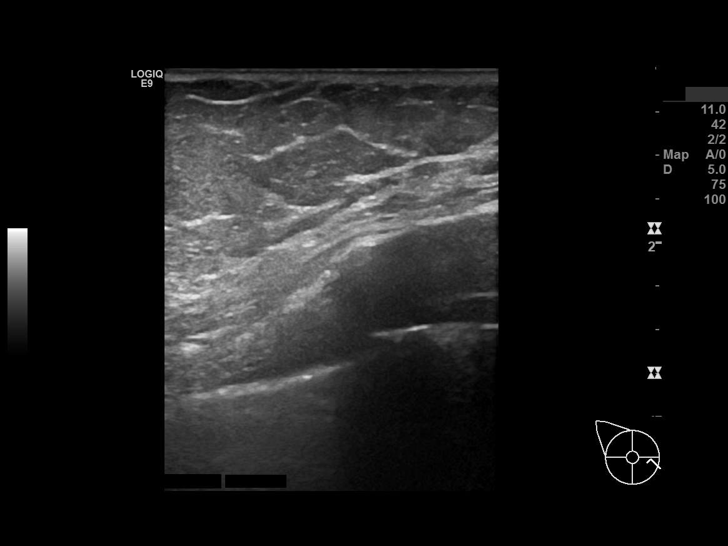
[im 10/10]
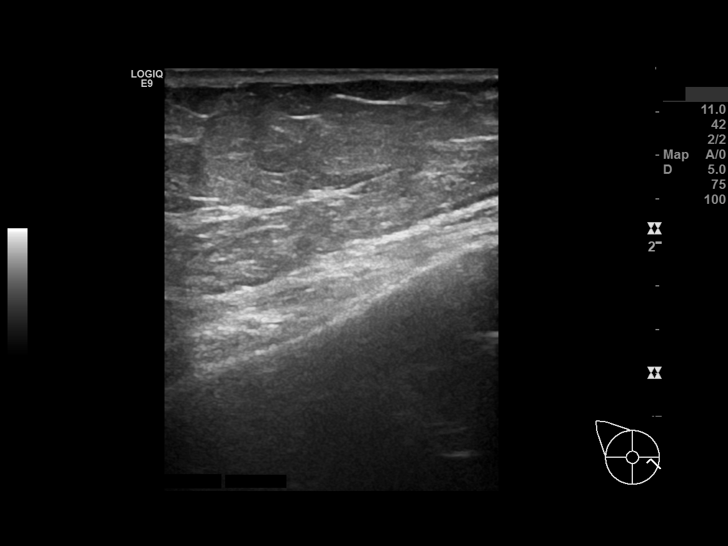

[10 of 10 positions shown; findings below may reference images not displayed]

MAMMOGRAM FINDINGS:

There are scattered areas of fibroglandular density. Additional evaluation was performed for an asymmetry seen in the medial aspect of the right breast on recent outside screening mammogram 07/11/2020. On today's examination, again seen is a triangular-shaped, striated asymmetry along the medial aspect of the right breast, suggestive of a normal sternalis muscle. Otherwise, no suspicious abnormality is seen in the right breast.

ULTRASOUND FINDINGS:

Targeted ultrasound along the medial aspect of the right breast demonstrates no solid or cystic mass or other suspicious sonographic finding.
IMPRESSION: There is no mammographic or sonographic evidence of malignancy.

A return to screening in 1 year is recommended.

The patient received a copy of the results at the end of the examination.

BI-RADS Category 2: Benign

## 2020-07-29 IMAGING — MG MAMMO DIAG RT W/CAD TOMO
7 series · 8 of 19 positions shown · non-contrast
Comparison: The present examination has been compared to prior imaging studies.

HISTORY: Patient is 63 years old and is seen for diagnostic evaluation of asymmetry in the medial right breast. The patient has no personal history of cancer. The patient does not have a first degree relative with breast cancer.
TECHNIQUE: Right 2-D digital diagnostic mammogram was performed followed by 3-D tomosynthesis. Real-time targeted ultrasound of the right breast was performed.  Current study was also evaluated with a computer aided detection (CAD) system.

[R CC (1 of 2)]
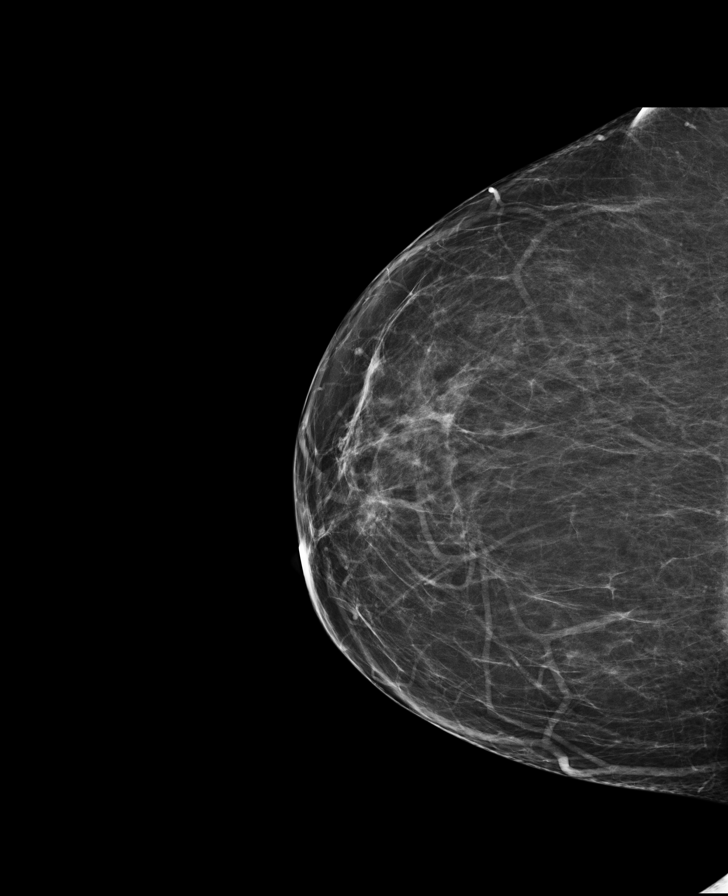

[R CV]
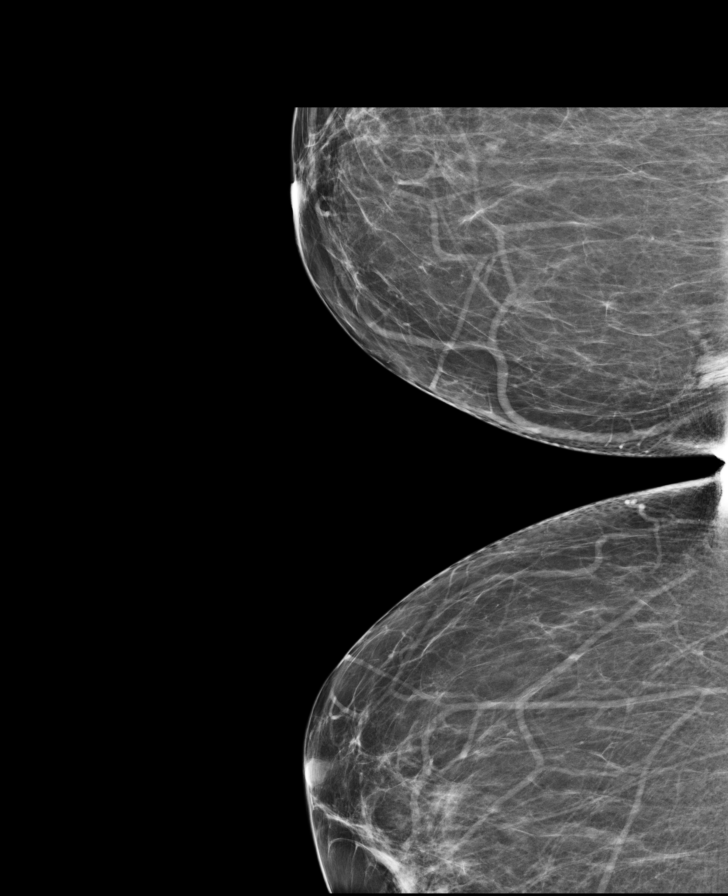

[R MLO]
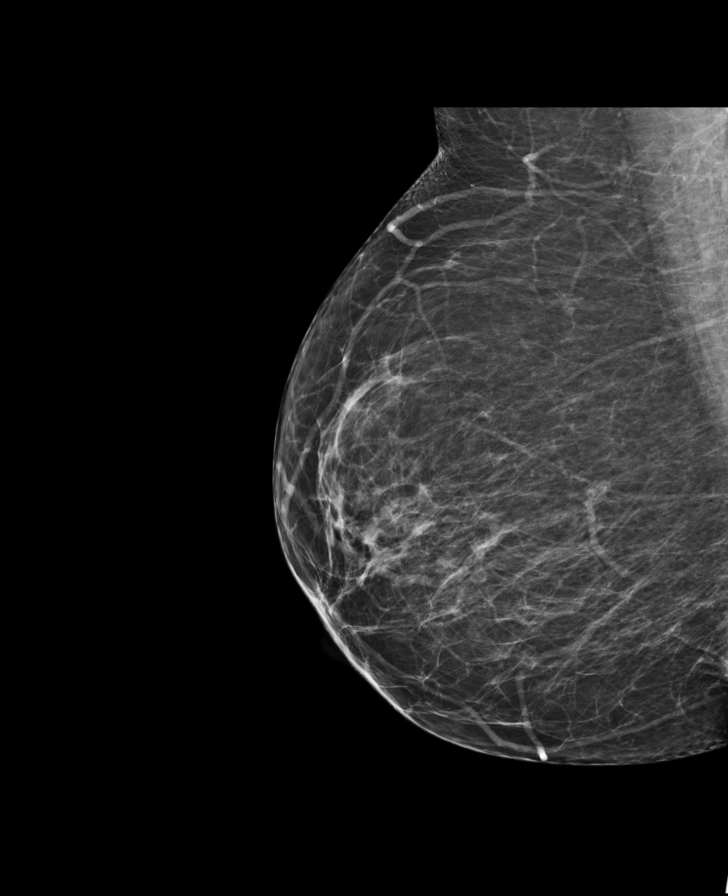

[R MLO tomo · 2 of 71 frames shown]
[frame 23/71]
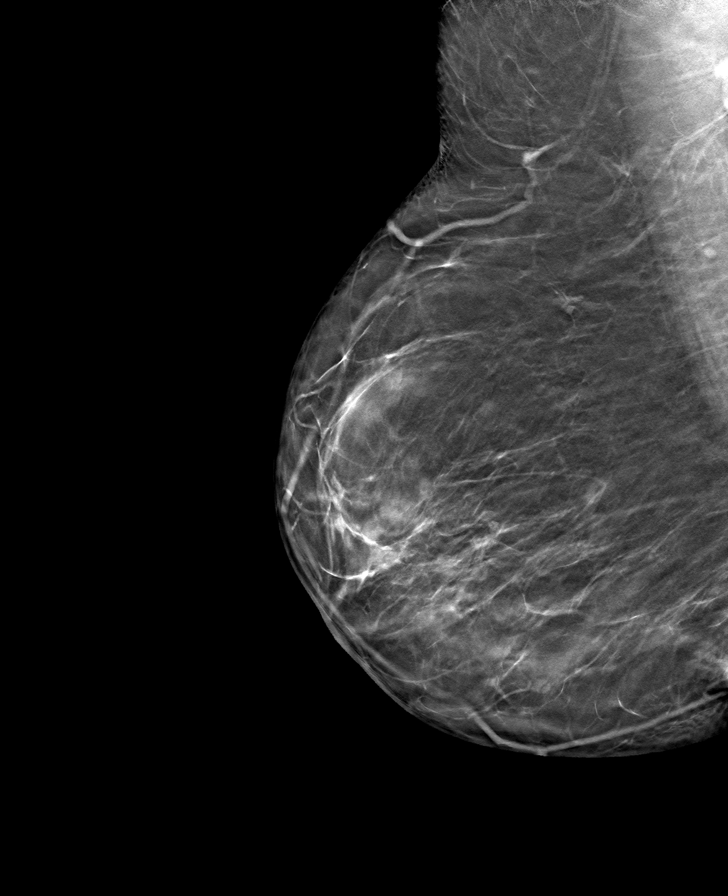
[frame 36/71]
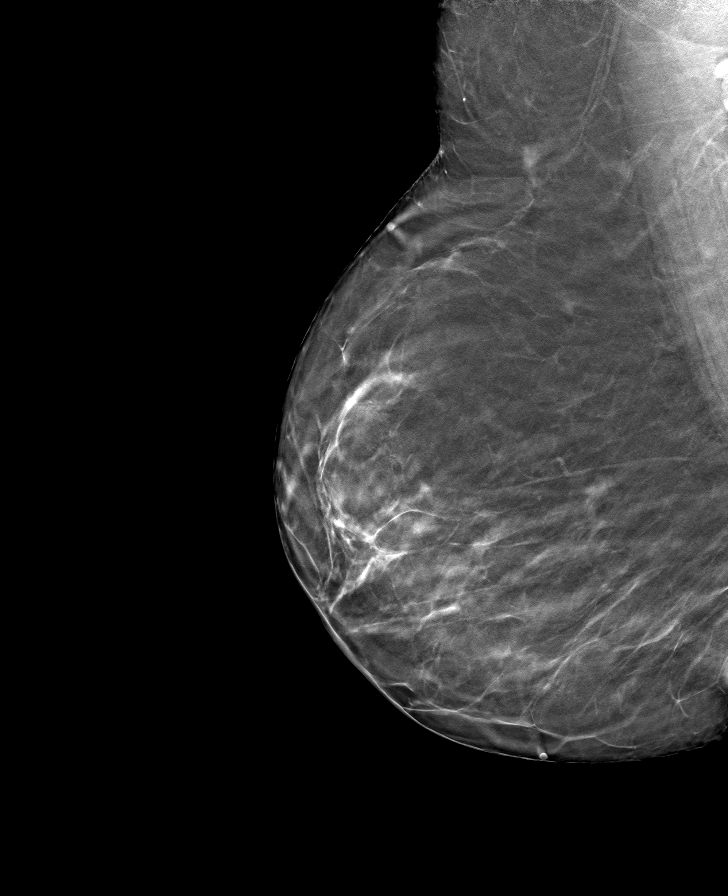

[R CV tomo · tomo slice 36/71.0]
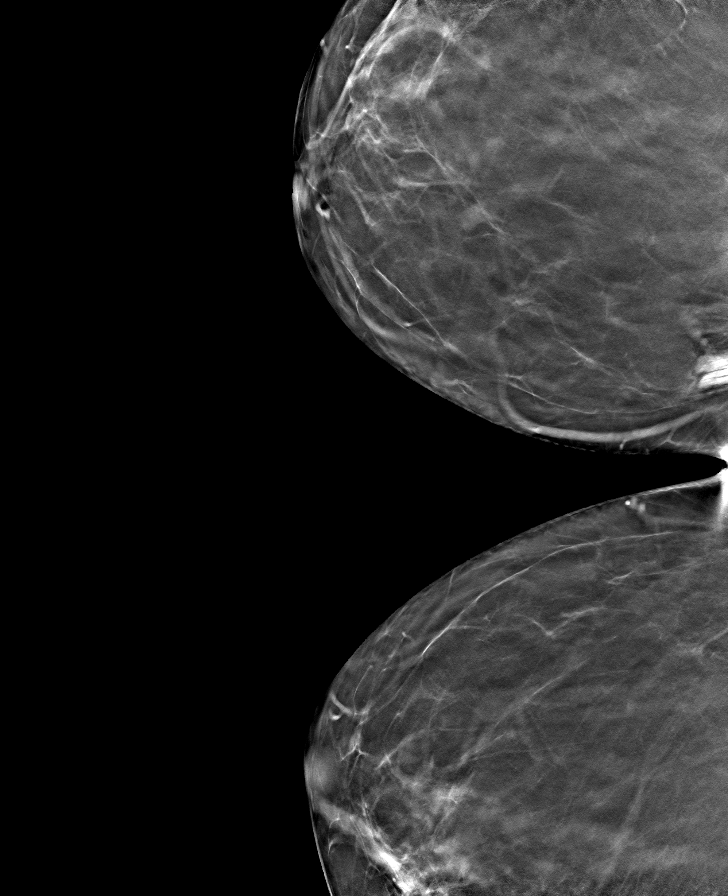

[R CC tomo · tomo slice 35/68.0]
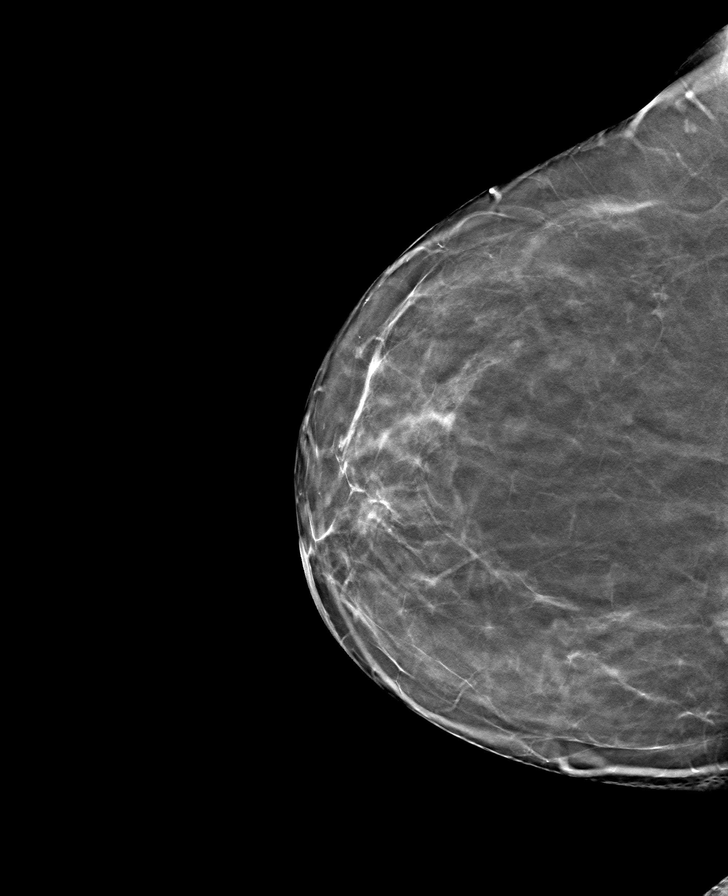

[R CC (2 of 2)]
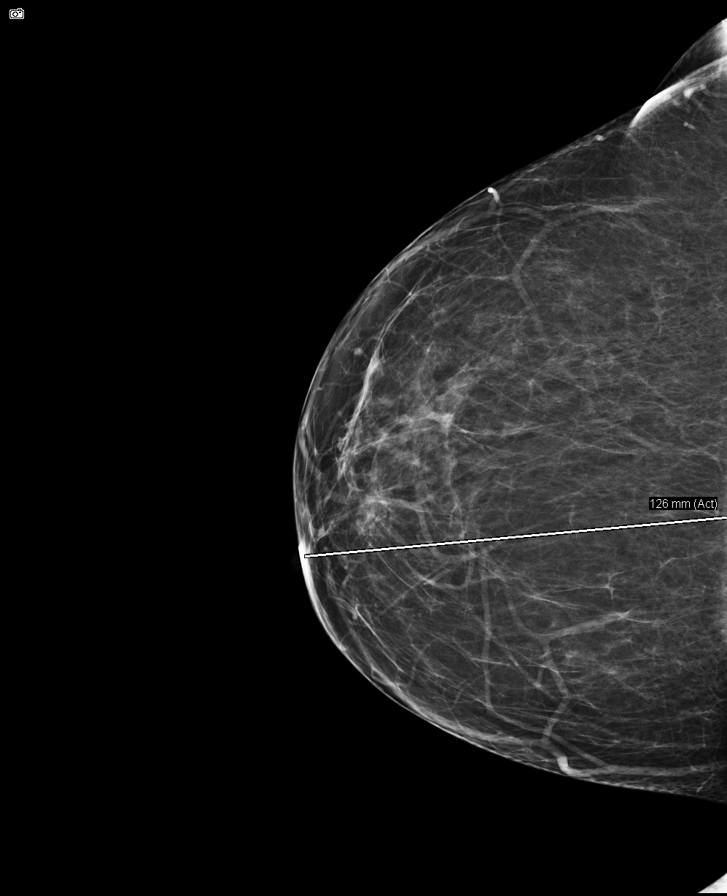

[8 of 19 positions shown; findings below may reference images not displayed]

MAMMOGRAM FINDINGS:

There are scattered areas of fibroglandular density. Additional evaluation was performed for an asymmetry seen in the medial aspect of the right breast on recent outside screening mammogram 07/11/2020. On today's examination, again seen is a triangular-shaped, striated asymmetry along the medial aspect of the right breast, suggestive of a normal sternalis muscle. Otherwise, no suspicious abnormality is seen in the right breast.

ULTRASOUND FINDINGS:

Targeted ultrasound along the medial aspect of the right breast demonstrates no solid or cystic mass or other suspicious sonographic finding.
IMPRESSION: There is no mammographic or sonographic evidence of malignancy.

A return to screening in 1 year is recommended.

The patient received a copy of the results at the end of the examination.

BI-RADS Category 2: Benign

## 2020-10-05 IMAGING — MR MRI LSPINE WO/W CONTRAST
6 of 9 series · 28 of 48 positions shown · IV contrast (prohance)
Comparison: Two views of lumbar spine study 10/05/2020.

HISTORY: 63-year-old female. Lumbar radiculopathy, bilateral leg pain.
TECHNIQUE: Multiplanar, multisequential MR images of the lumbar spine were obtained before and after administration of 20 cc of ProHance intravenous contrast.

[Series 2: t2_sag · sagittal · 4.0mm · 0.87mm/px · 4 of 17 slices shown]
[im 1/17]
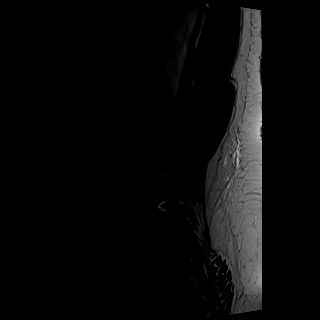
[im 6/17]
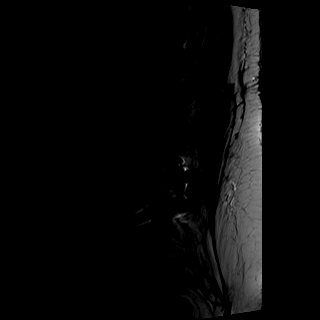
[im 11/17]
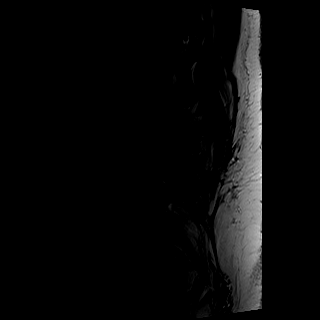
[im 17/17]
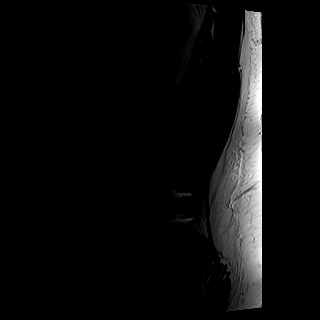

[Series 3: ir_sag · sagittal · 4.0mm · 1.09mm/px · 3 of 17 slices shown]
[im 1/17]
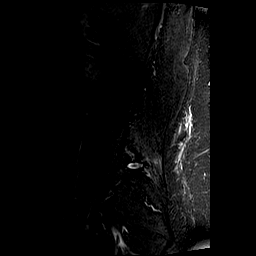
[im 9/17]
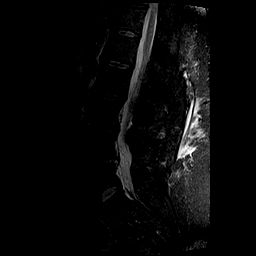
[im 17/17]
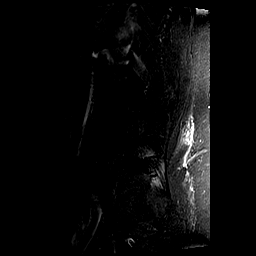

[Series 4: t1_sag · sagittal · 4.0mm · 0.87mm/px · 3 of 17 slices shown]
[im 1/17]
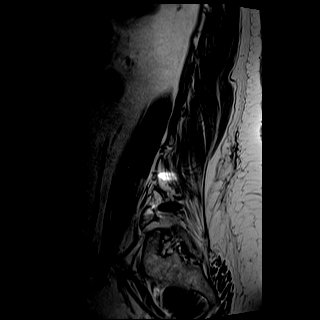
[im 9/17]
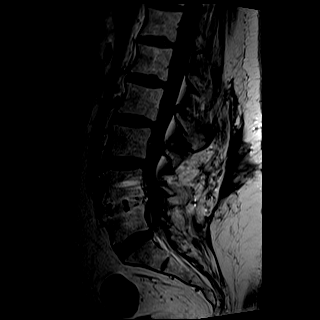
[im 17/17]
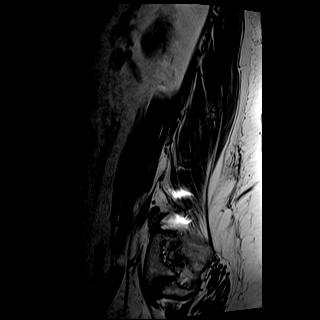

[Series 7: t2_axial · axial · 4.0mm · 0.41mm/px · z∈[-122,+88]mm · 9 of 42 slices shown]
[im 1/42]
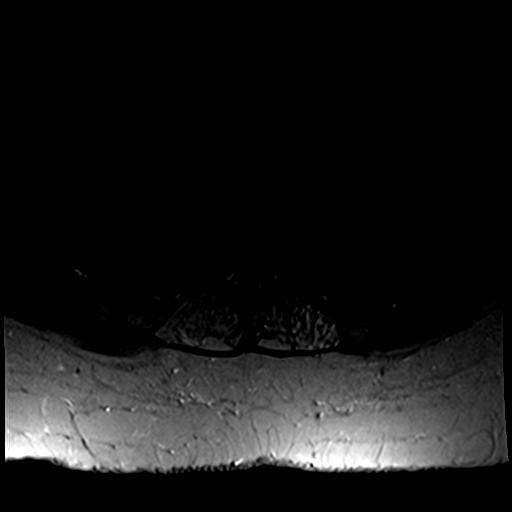
[im 6/42]
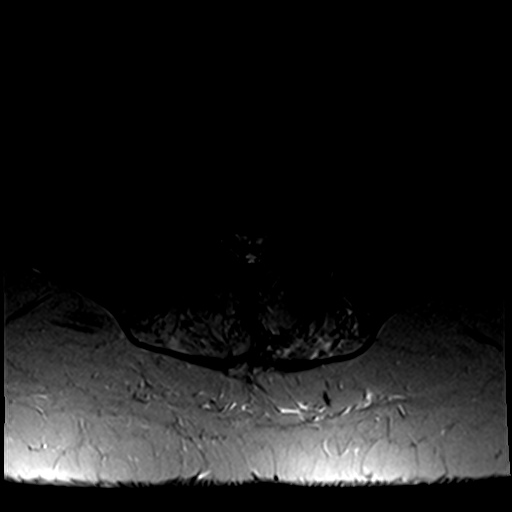
[im 11/42]
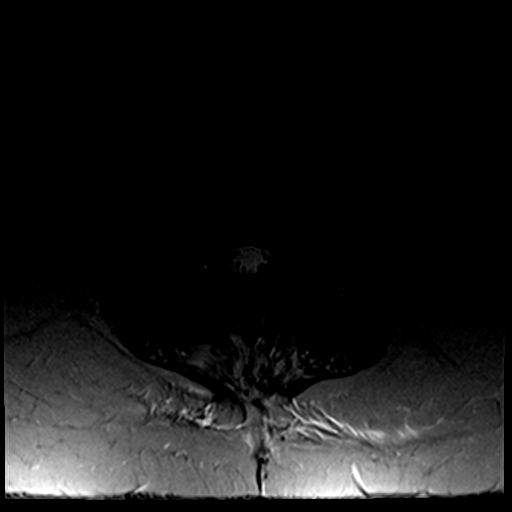
[im 16/42]
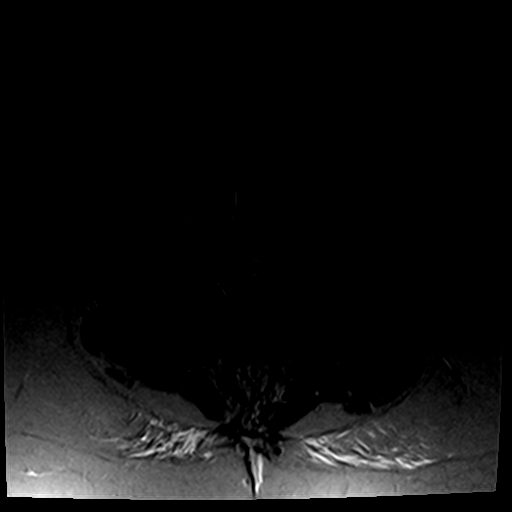
[im 21/42]
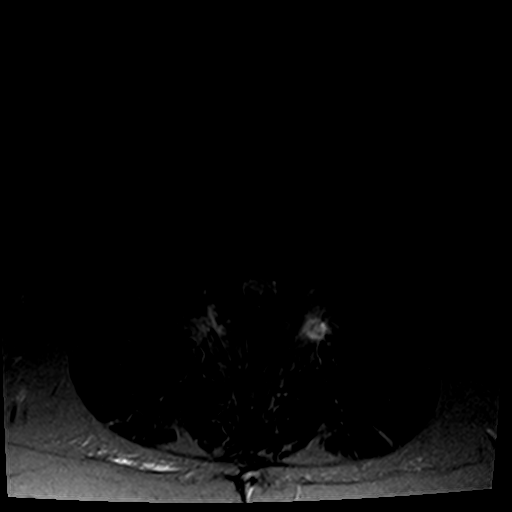
[im 26/42]
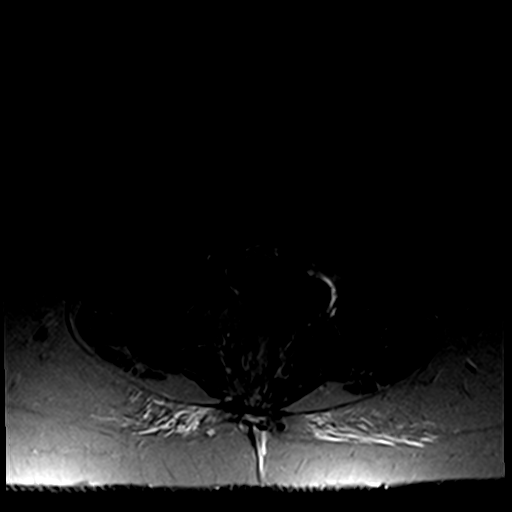
[im 31/42]
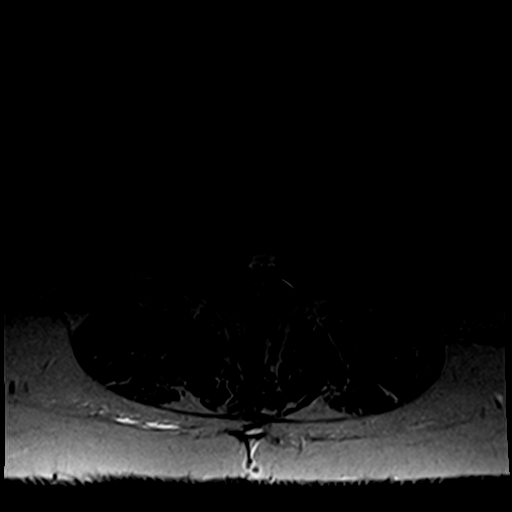
[im 36/42]
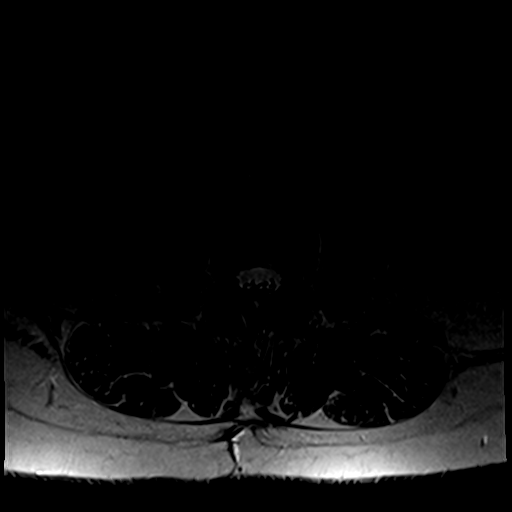
[im 42/42]
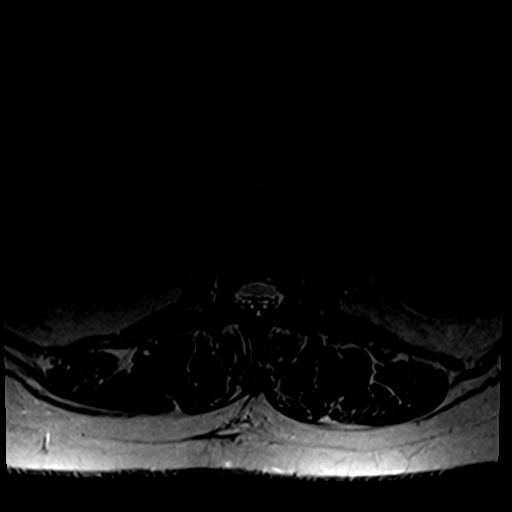

[Series 8: t1_axial_obl · axial · 4.0mm · 0.41mm/px · z∈[-120,+100]mm · 5 of 26 slices shown]
[im 1/26]
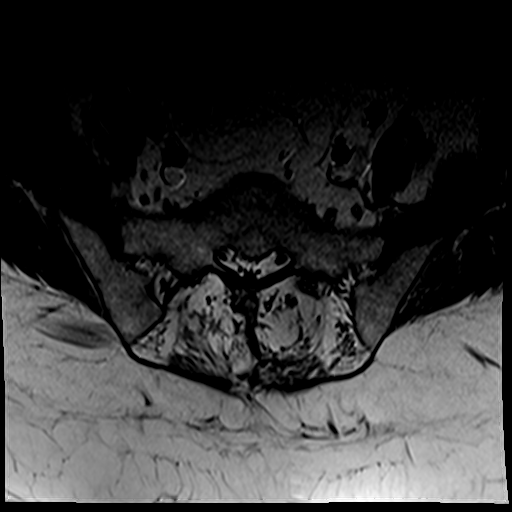
[im 7/26]
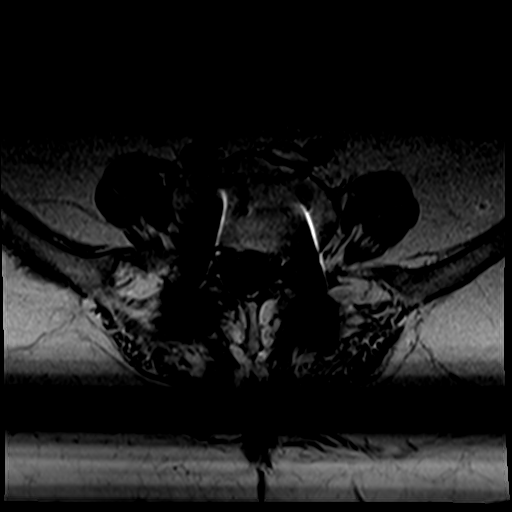
[im 13/26]
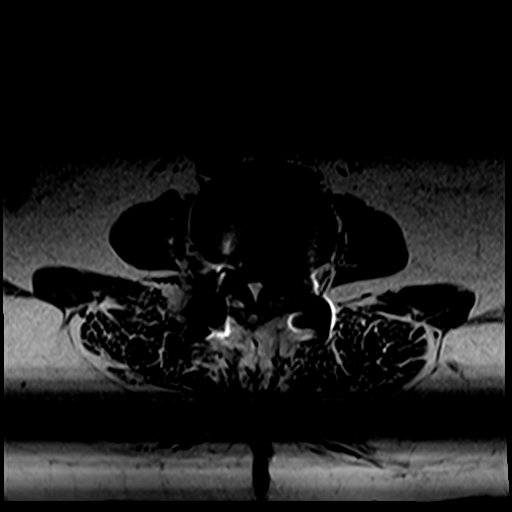
[im 19/26]
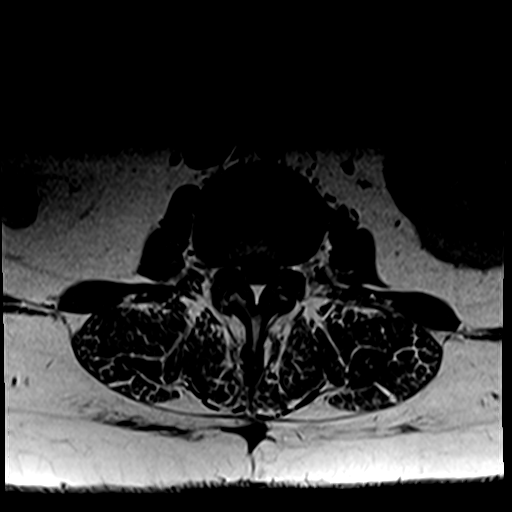
[im 26/26]
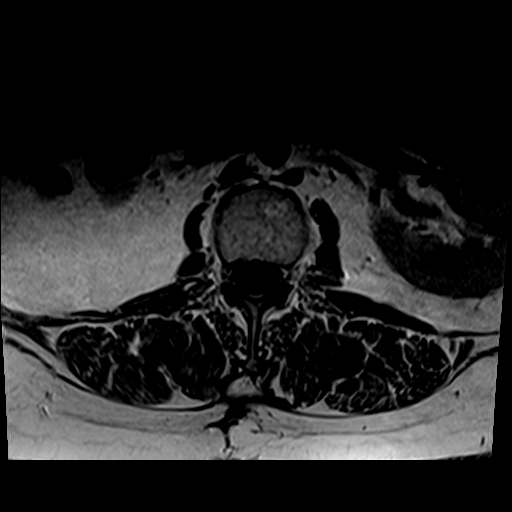

[Series 11: t1_axial_pre · axial · 4.0mm · 0.82mm/px · z∈[-122,-50]mm · 4 of 42 slices shown]
[im 1/42]
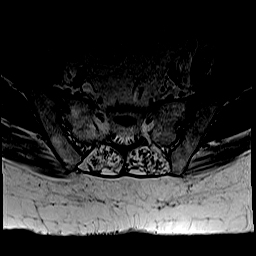
[im 6/42]
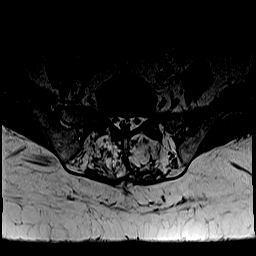
[im 11/42]
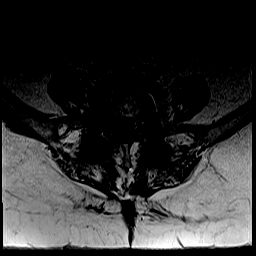
[im 16/42]
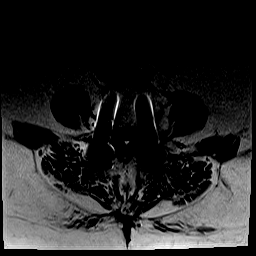

[28 of 48 positions shown; findings below may reference images not displayed]

FINDINGS: Vertebral body height and alignment: The patient is status post L4 and L5 laminectomies. Patient is status post transpedicular fusion of L4 and L5 levels. Disc space implant is identified at the L4-5 level.

Conus medullaris and cauda equina: The conus and  the cauda equina have normal signal.

Marrow signal: Metallic hardware obscures detailed evaluation of the marrow signal involving L4 and L5 levels.

Degenerative disc changes: No significant degenerative disc changes are present.

At the L5-S1 level, the visualized portion of the spinal canal and the neural foramina is patent.

At the L4-5 level, the spinal canal and the neural foramina are patent.

At the L3-4 level, the spinal canal and the neural foramina are patent.

At the L2-3 level, there are facet arthropathy changes. The spinal canal and the neural foramina are patent.

At the L1-2 level, the spinal canal and  the neural foramina are patent

At the T12-L1 level, the spinal canal and the neural foramina are patent.
IMPRESSION: 1. Status post lumbar spine surgery described as above.

2. No MRI evidence of severe lumbar spinal canal stenosis, disc herniation, or neural foraminal impingement.

## 2020-10-05 IMAGING — CR [HOSPITAL] L SPINE
1 series · 2 of 2 positions shown · non-contrast
Comparison: None

HISTORY: Lumbar radiculopathy
TECHNIQUE: Lumbar spine 2 views; No Charge

[Series 1: ap · 0.17mm/px · 2 of 2 slices shown]
[im 1/2]
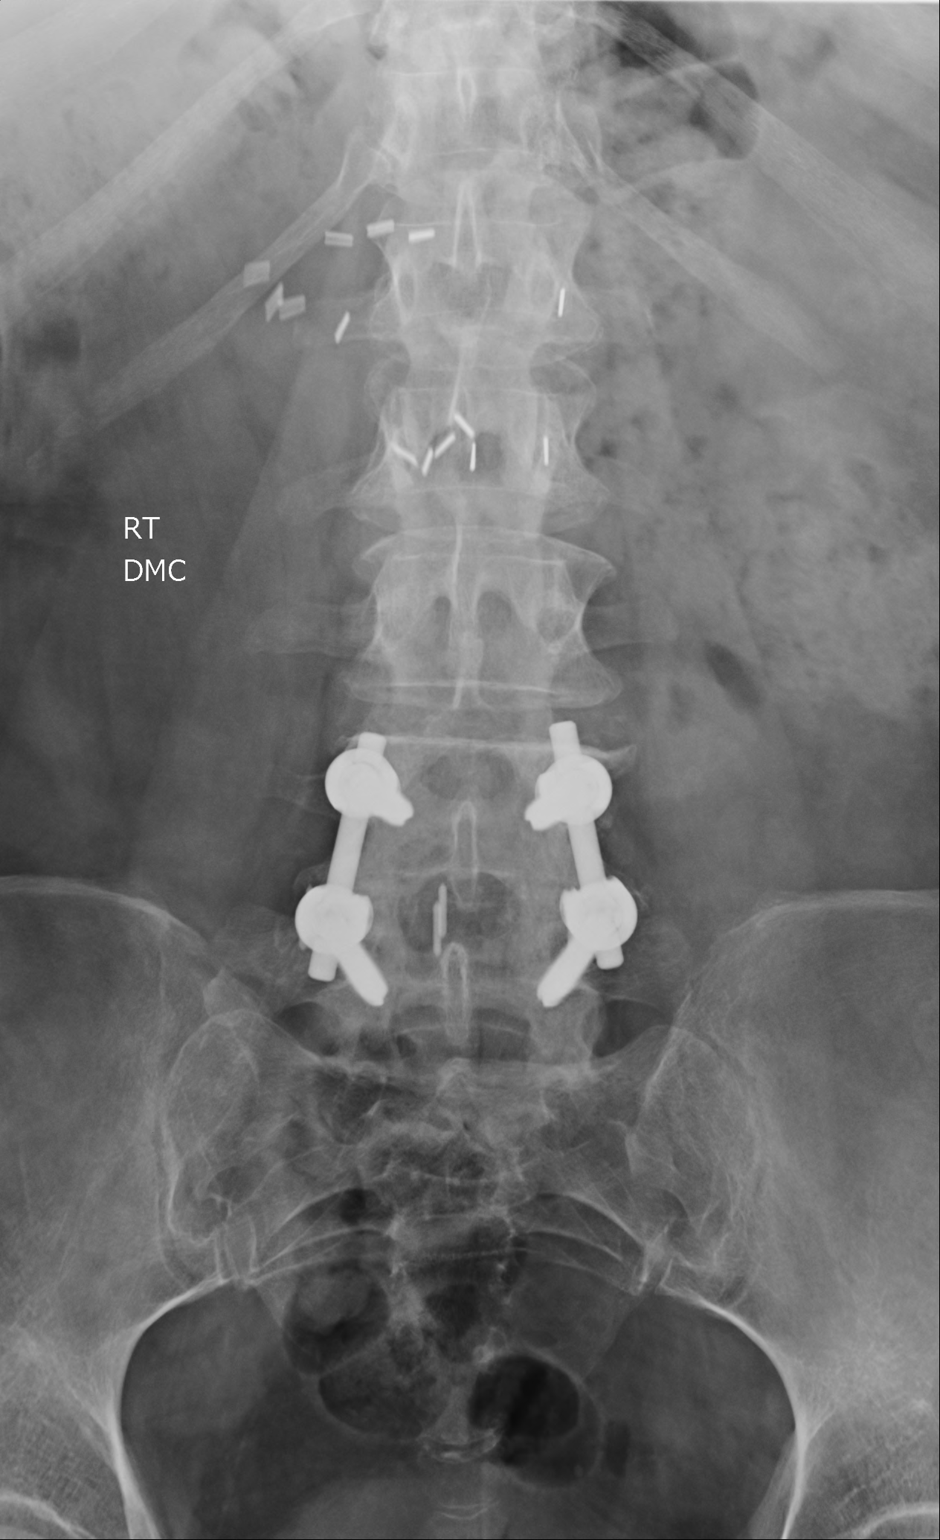
[im 2/2]
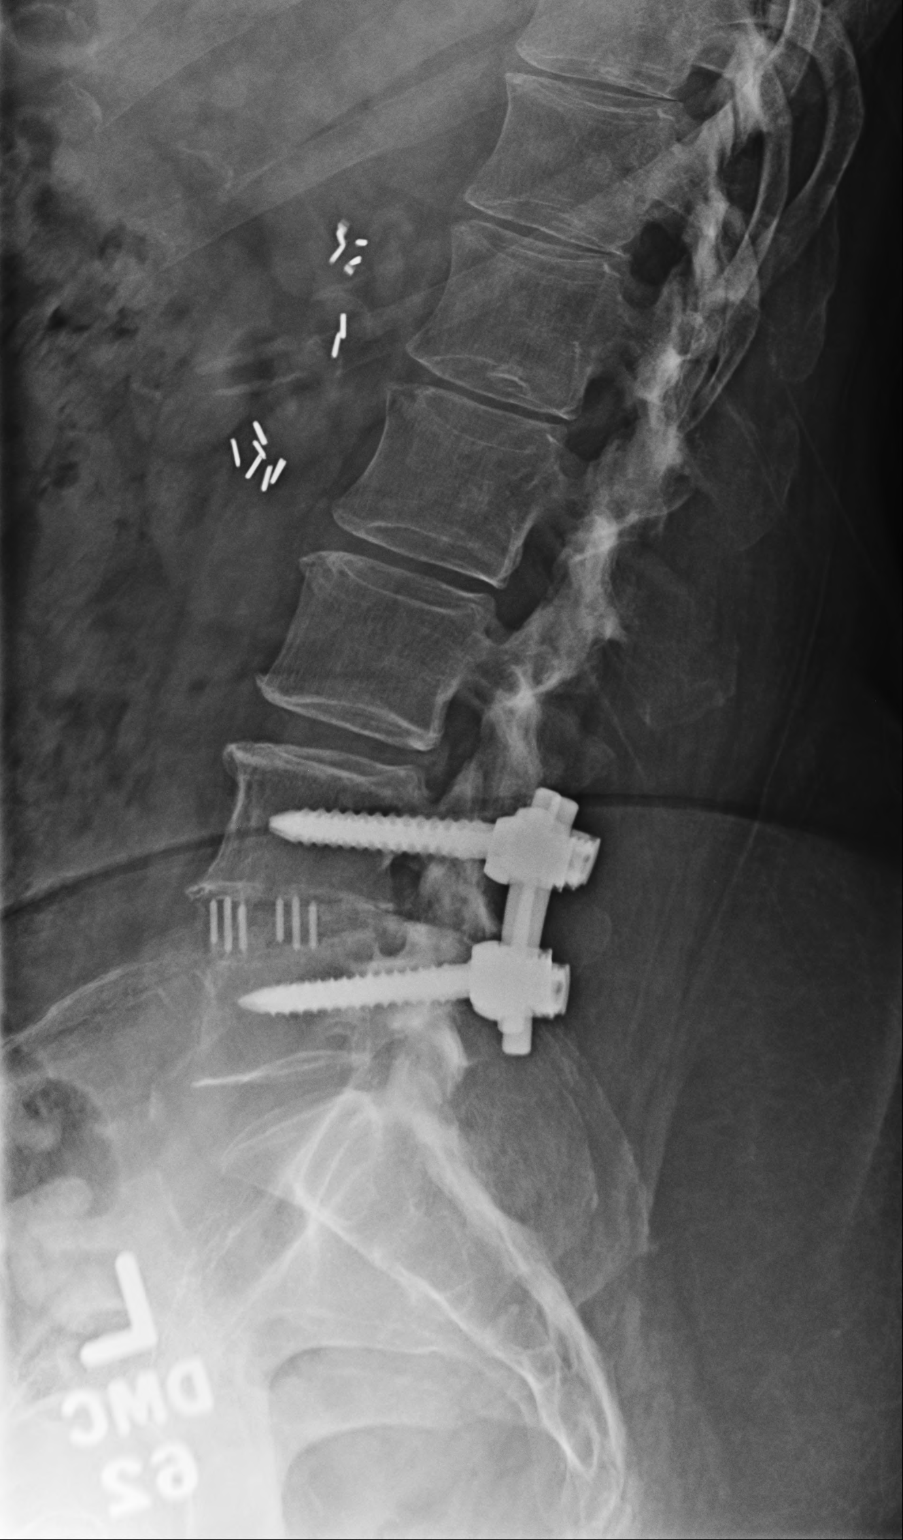

[2 of 2 positions shown; findings below may reference images not displayed]

FINDINGS: 2 view lumbar spine demonstrates total of 5 lumbar segments. Postop fusion and discectomy at L4-5. Generalized demineralization. No fractures.
IMPRESSION: 5 lumbar segments. Postoperative changes L4-5. No fracture or destructive process.

## 2021-01-30 IMAGING — CR [HOSPITAL] CHEST 2VWS PA AND LAT
2 series · 2 of 2 positions shown · non-contrast
Comparison: None.

HISTORY: 64 years-old Female with Shortness of breath.
TECHNIQUE: Chest 2 views

[w chest pa]
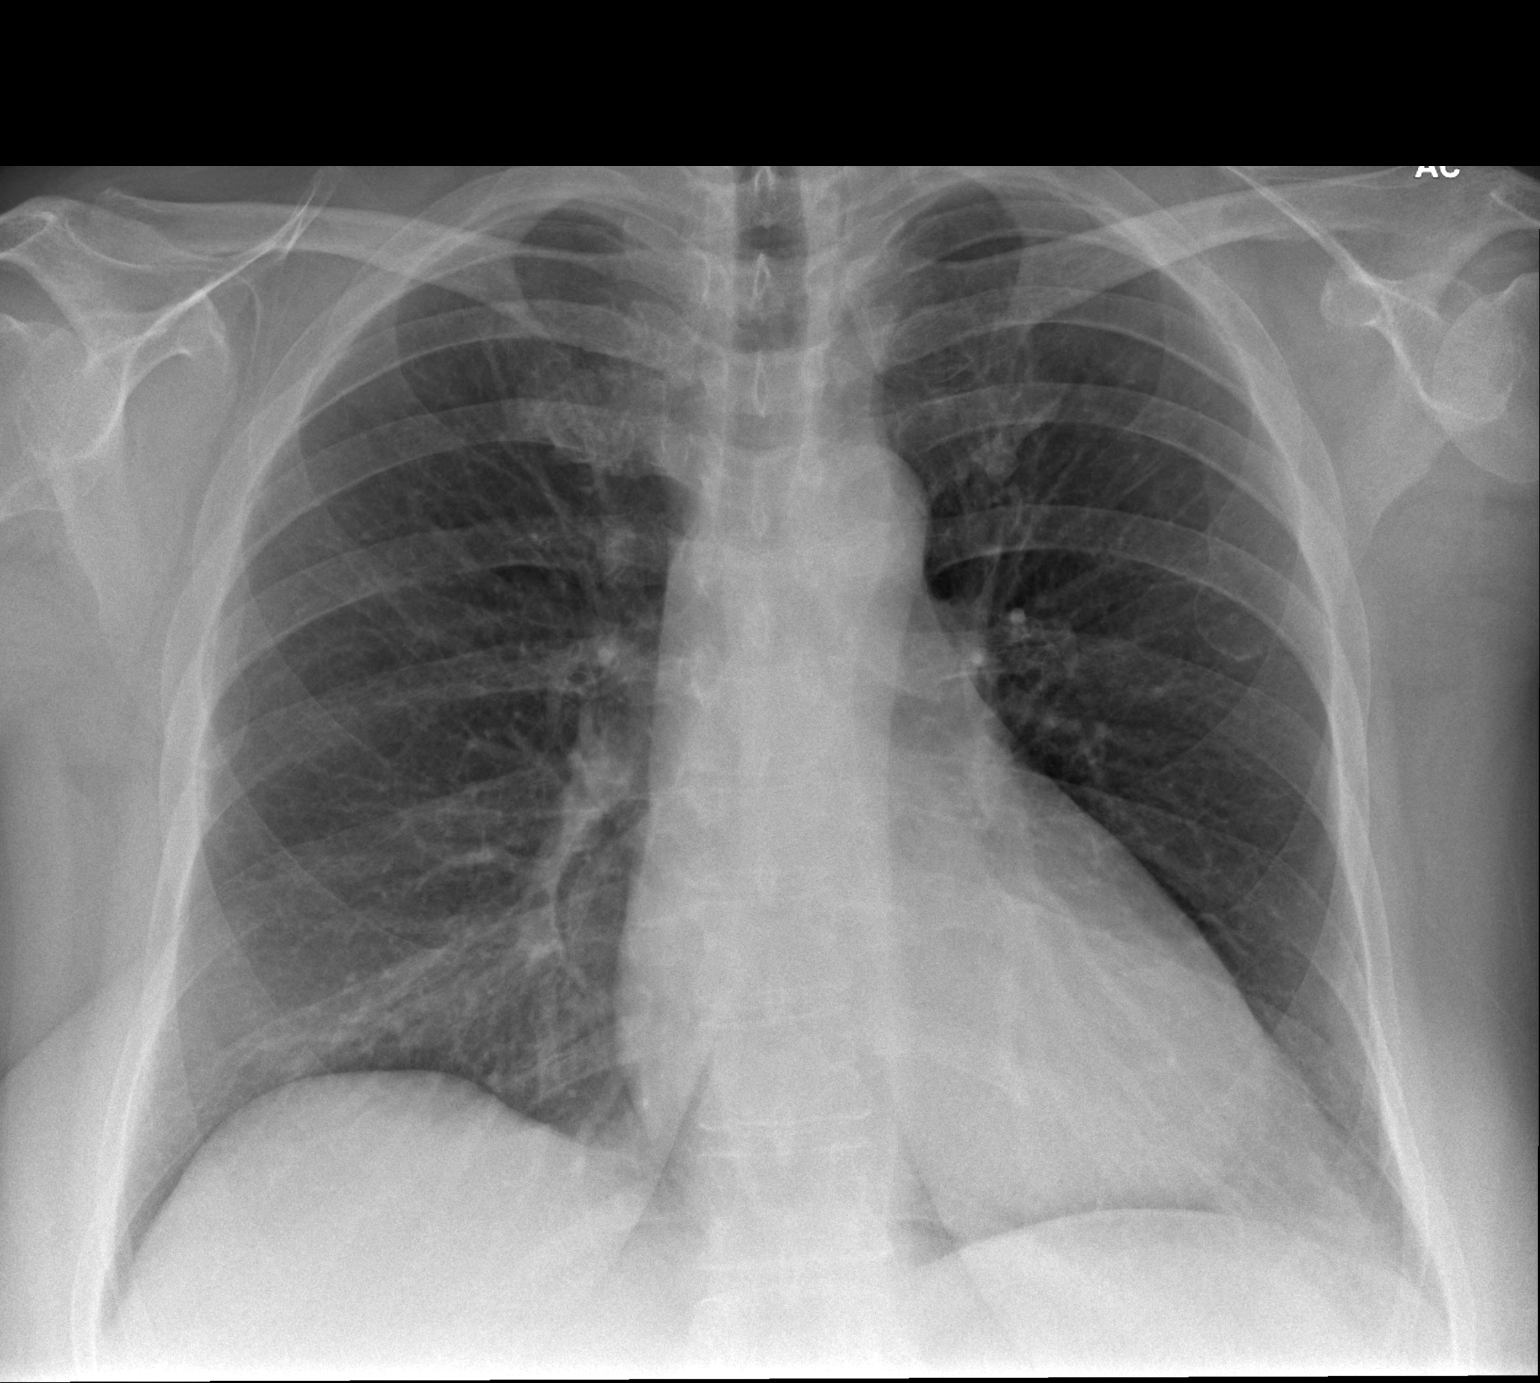

[w chest lat]
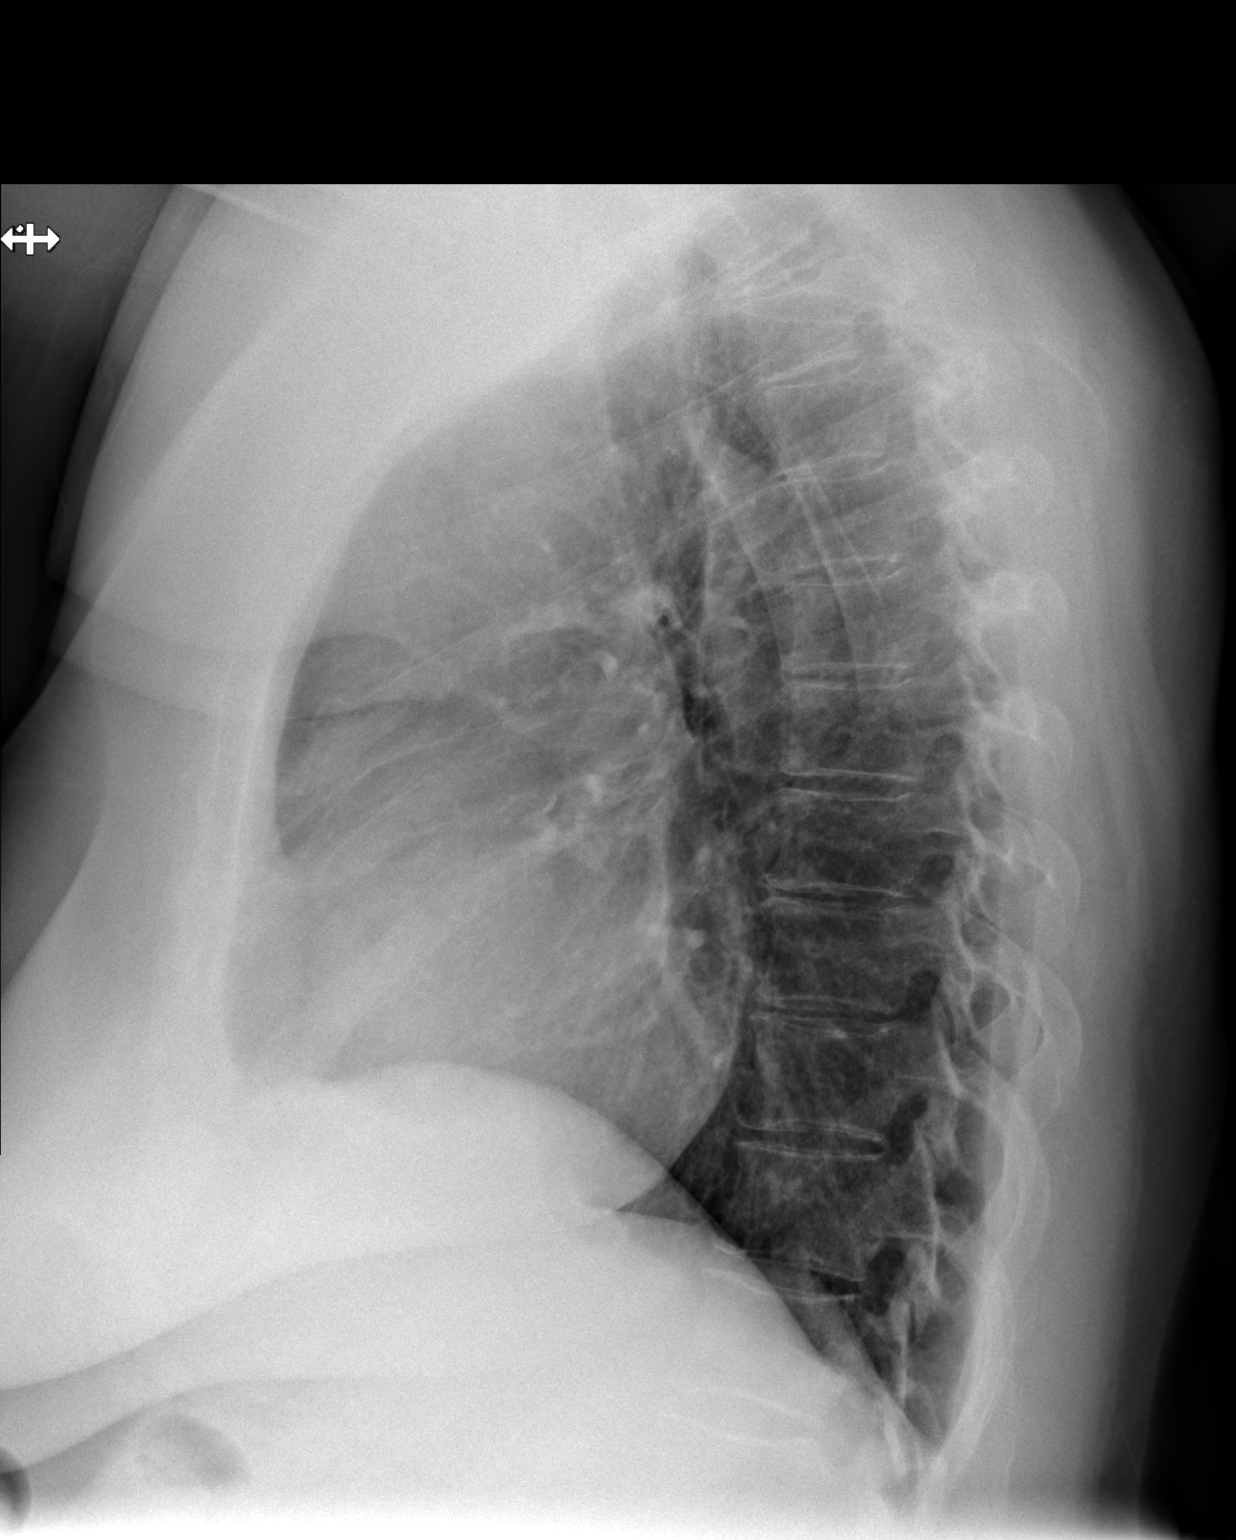

[2 of 2 positions shown; findings below may reference images not displayed]

FINDINGS: There is no definite airspace disease, pneumothorax, pleural effusion or significant pulmonary edema.

The aorta is not tortuous. Atherosclerosis of the aorta is not present.  The cardiac silhouette is not enlarged.
IMPRESSION: No acute cardiopulmonary disease.

## 2021-01-30 IMAGING — MR MRI BRAIN WO CONTRAST
9 series · 48 of 48 positions shown · non-contrast
Comparison: None

INDICATION: Syncope and collapse with trauma to the back of the head.
TECHNIQUE: MRI of the brain without contrast.  Multiplanar, multisequence MR images of the brain were performed without intravenous contrast.

[Series 1: bSSFP · axial · 8.0mm · 1.17mm/px · 1 of 19 slices shown]
[im 1/19]
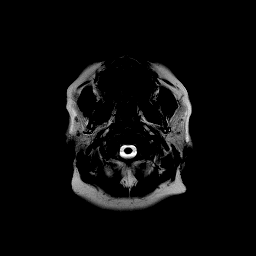

[Series 2: t1_mprage_axial · axial · 1.0mm · 1.00mm/px · z∈[-47,+112]mm · 11 of 160 slices shown]
[im 1/160]
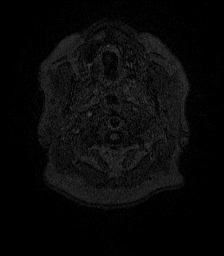
[im 16/160]
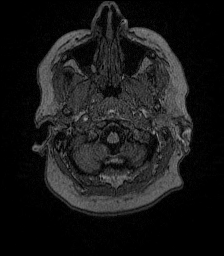
[im 32/160]
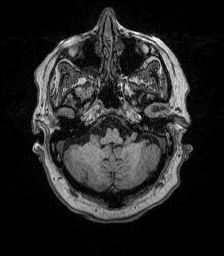
[im 48/160]
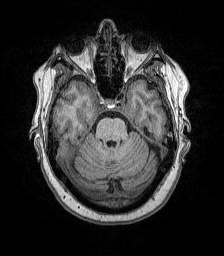
[im 64/160]
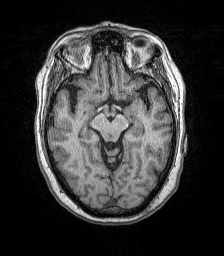
[im 80/160]
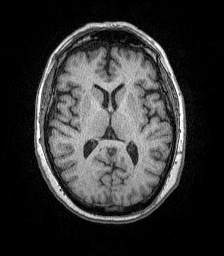
[im 96/160]
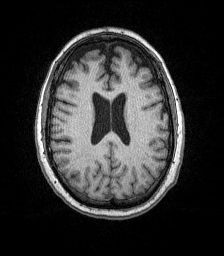
[im 112/160]
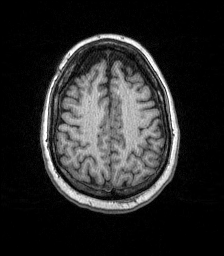
[im 128/160]
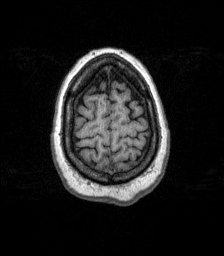
[im 144/160]
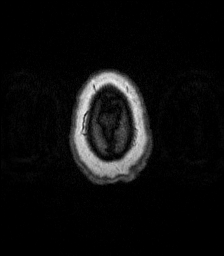
[im 160/160]
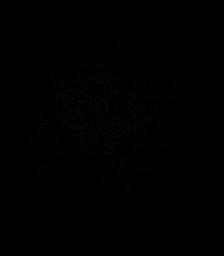

[Series 3: flair_axial_fs · axial · 5.0mm · 0.45mm/px · 1 of 24 slices shown]
[im 1/24]
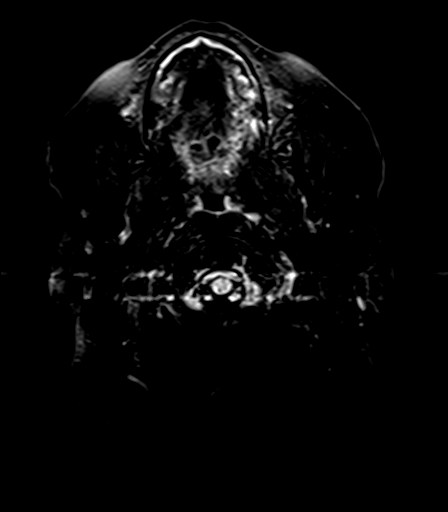

[Series 4: t1_mprage_cor_reformat · coronal · 1.5mm · 0.79mm/px · 15 of 203 slices shown]
[im 1/203]
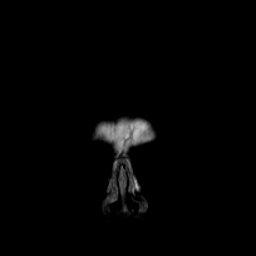
[im 15/203]
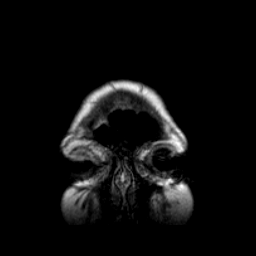
[im 29/203]
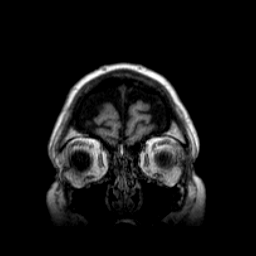
[im 44/203]
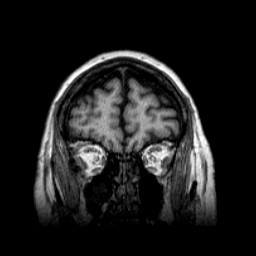
[im 58/203]
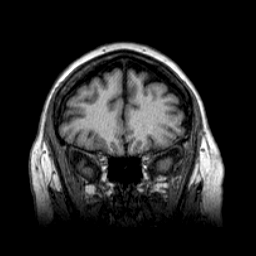
[im 73/203]
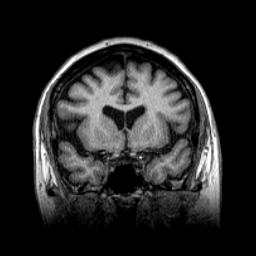
[im 87/203]
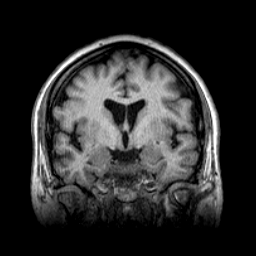
[im 102/203]
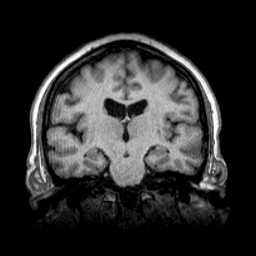
[im 116/203]
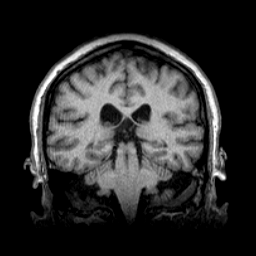
[im 130/203]
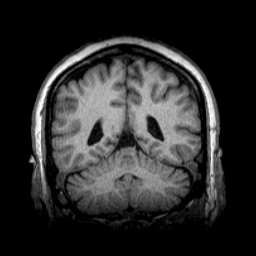
[im 145/203]
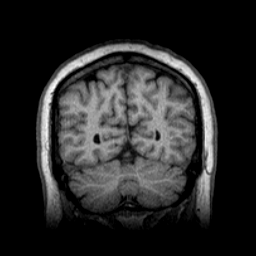
[im 159/203]
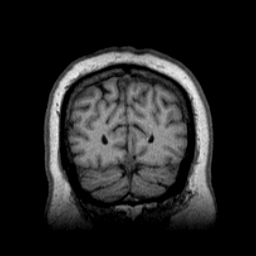
[im 174/203]
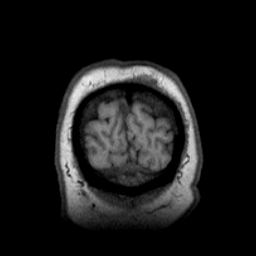
[im 188/203]
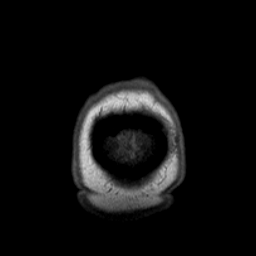
[im 203/203]
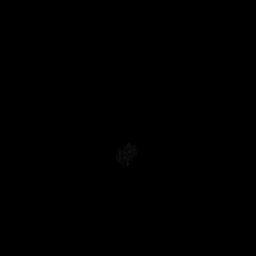

[Series 5: t1_mprage_sag_reformat · sagittal · 1.5mm · 0.91mm/px · 12 of 163 slices shown]
[im 1/163]
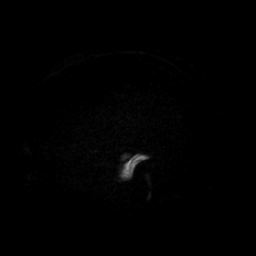
[im 15/163]
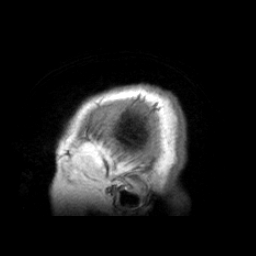
[im 30/163]
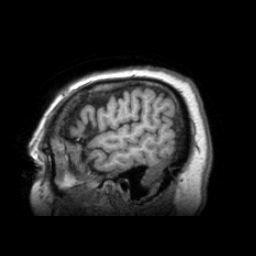
[im 45/163]
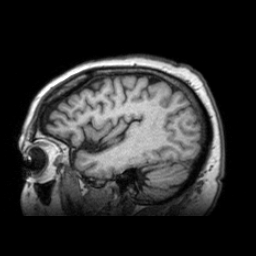
[im 59/163]
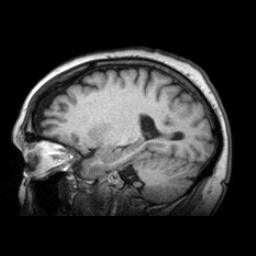
[im 74/163]
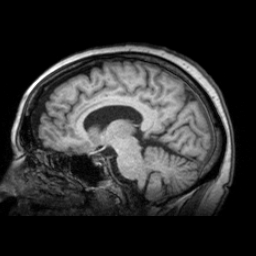
[im 89/163]
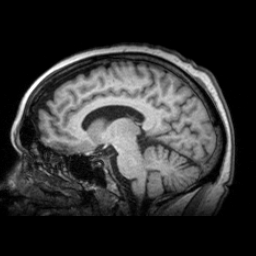
[im 104/163]
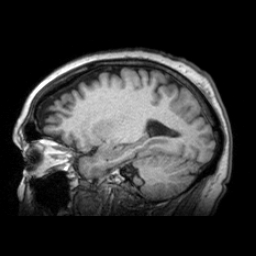
[im 118/163]
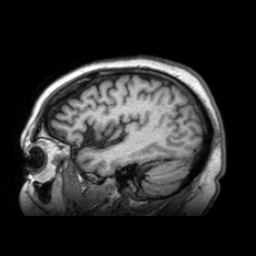
[im 133/163]
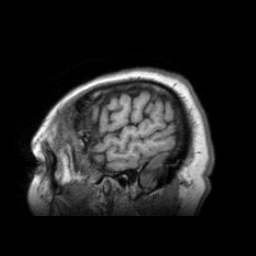
[im 148/163]
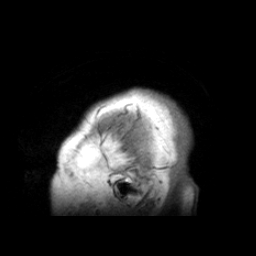
[im 163/163]
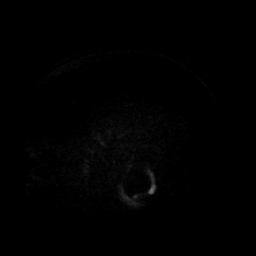

[Series 6: t2_axial · axial · 5.0mm · 0.72mm/px · z∈[-53,+91]mm · 2 of 24 slices shown]
[im 1/24]
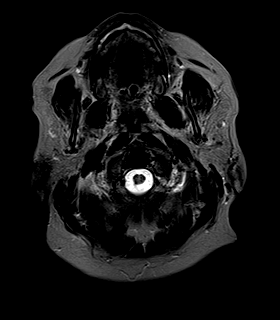
[im 24/24]
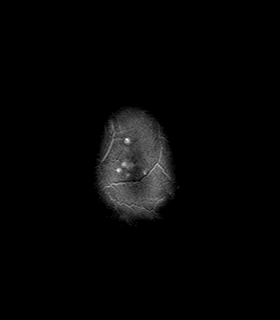

[Series 7: DWI · axial · 5.0mm · 1.26mm/px · z∈[-54,+90]mm · 2 of 24 slices shown (1 of 2)]
[im 1/24]
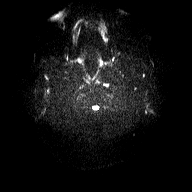
[im 24/24]
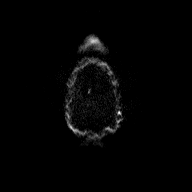

[Series 8: DWI · axial · 5.0mm · 1.26mm/px · z∈[-54,+90]mm · 2 of 24 slices shown (2 of 2)]
[im 1/24]
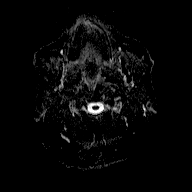
[im 24/24]
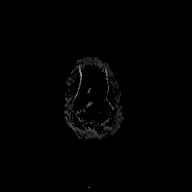

[Series 9: flash_axial · axial · 5.0mm · 0.45mm/px · z∈[-53,+91]mm · 2 of 24 slices shown]
[im 1/24]
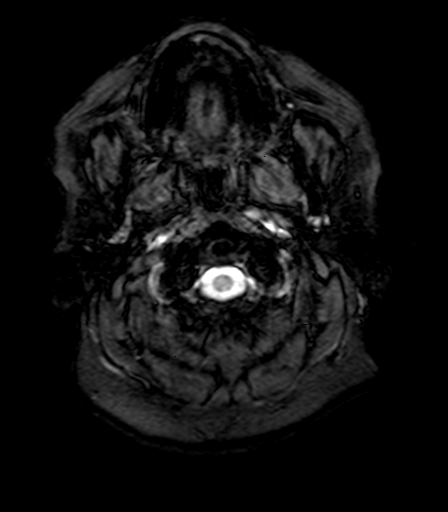
[im 24/24]
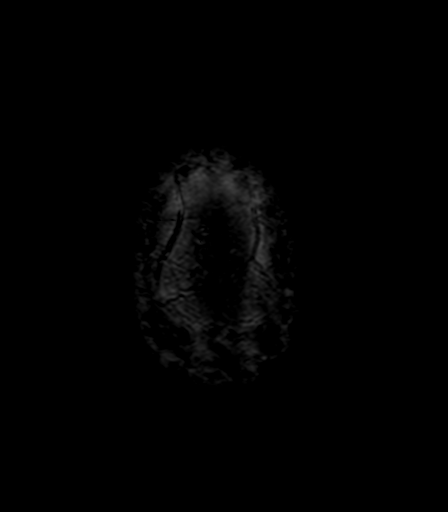

[48 of 48 positions shown; findings below may reference images not displayed]

FINDINGS: There is mild brain atrophy in keeping with the patient's age. There are a few small focal areas of increased signal intensity in the right anterior frontal subcortical white matter seen on FLAIR sequence. There is otherwise abnormal signal intensity of the gray and white matter. Specifically no abnormal signal intensity seen in the diffusion or gradient sequences to suggest acute ischemia or previous intracranial hemorrhage. These findings are in keeping with demyelination which may be due to chronic ischemia. There is no mass, shift, ventricular dilatation or abnormal extra-axial fluid collection. Signal intensity of the bone marrow is normal. The intracranial arteries have normal signal flow voids.

There is right posterior maxillary mucosal thickening measuring approximately 14 mm with minimal bilateral frontal, ethmoidal, sphenoidal and left maxillary mucosal thickening in keeping with chronic sinusitis. Mastoids are clear.
IMPRESSION: 1. Mild brain atrophy in keeping with the patient's age.

2. Small focal areas of nonspecific demyelination in the right frontal subcortical white matter most likely due to chronic ischemia.

## 2021-03-22 IMAGING — MR MRI CSPINE WO CONTRAST
6 series · 48 of 48 positions shown · IV contrast (agent unspecified)
Comparison: None.

HISTORY: 64 years old female with cervicalgia
TECHNIQUE: Multiplanar, multisequence MR images of the cervical spine were obtained without intravenous contrast. 

CONTRAST: None.

[Series 1: bSSFP · axial · 6.0mm · 1.17mm/px · z∈[-51,+142]mm · 10 of 22 slices shown]
[im 1/22]
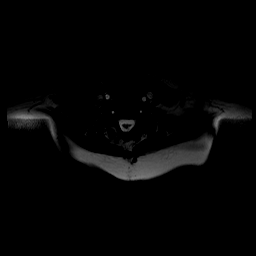
[im 3/22]
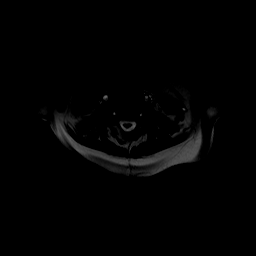
[im 5/22]
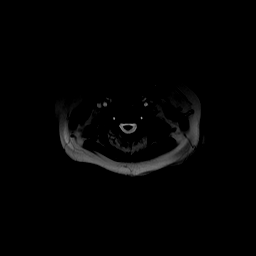
[im 8/22]
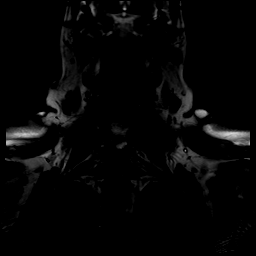
[im 10/22]
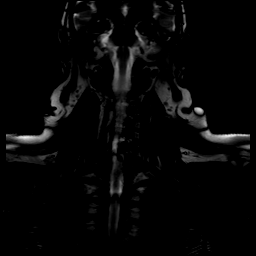
[im 12/22]
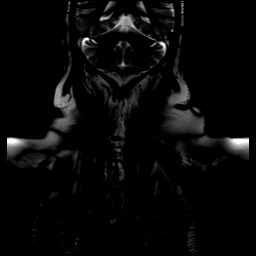
[im 15/22]
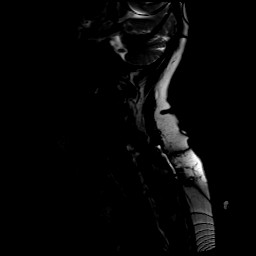
[im 17/22]
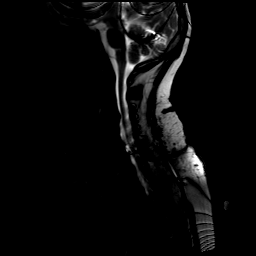
[im 19/22]
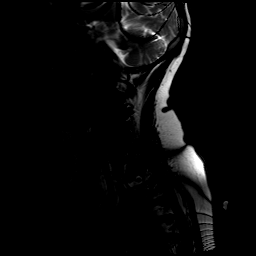
[im 22/22]
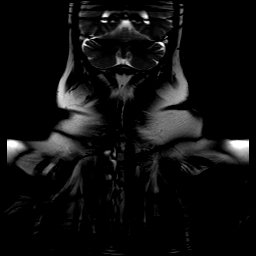

[Series 2: t2_sag_hbw · sagittal · 3.0mm · 0.86mm/px · 7 of 16 slices shown]
[im 1/16]
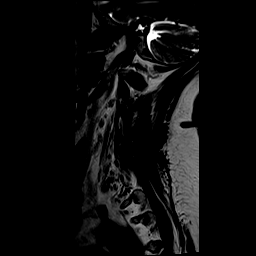
[im 3/16]
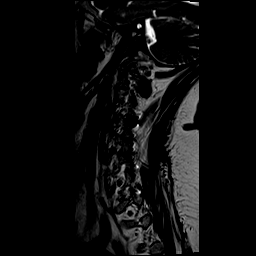
[im 6/16]
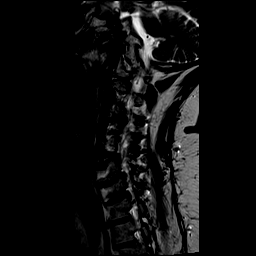
[im 8/16]
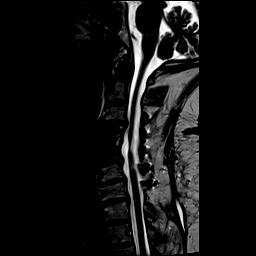
[im 11/16]
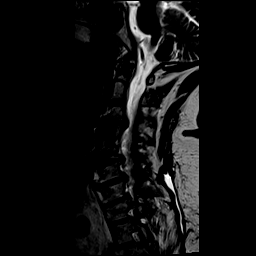
[im 13/16]
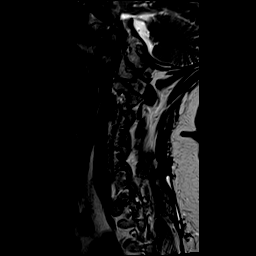
[im 16/16]
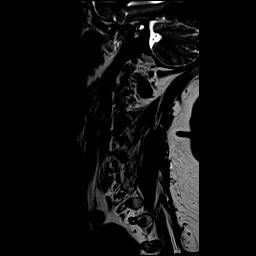

[Series 3: t1_sag_hbw · sagittal · 3.0mm · 0.69mm/px · 6 of 16 slices shown]
[im 1/16]
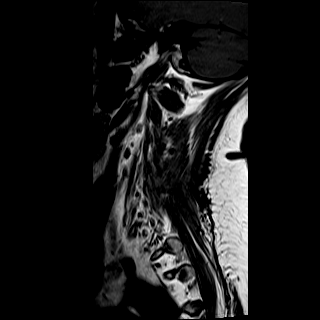
[im 4/16]
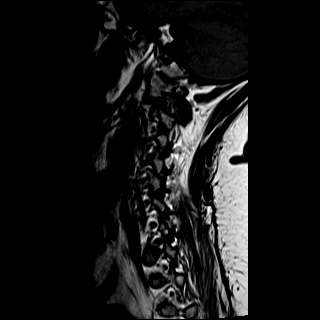
[im 7/16]
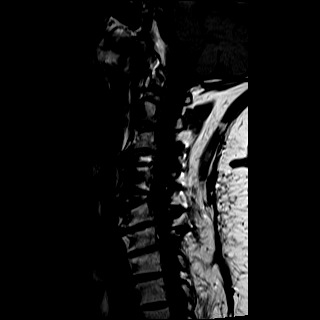
[im 10/16]
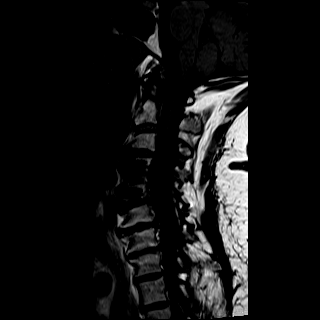
[im 13/16]
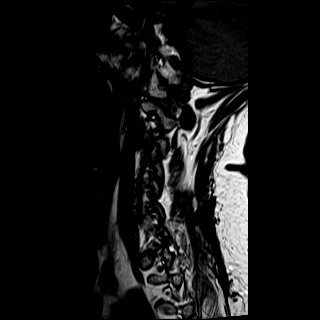
[im 16/16]
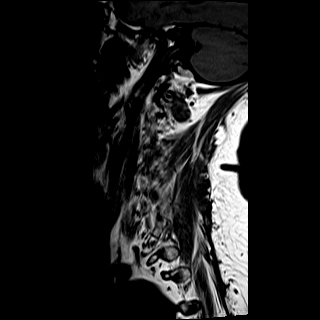

[Series 4: ir_sag_hbw · sagittal · 3.0mm · 0.86mm/px · 6 of 16 slices shown]
[im 1/16]
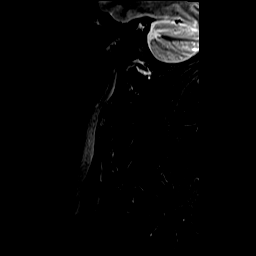
[im 4/16]
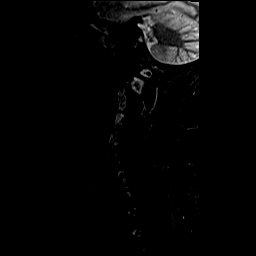
[im 7/16]
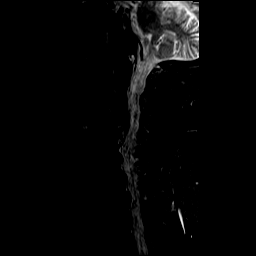
[im 10/16]
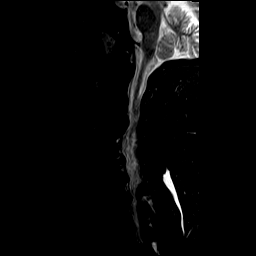
[im 13/16]
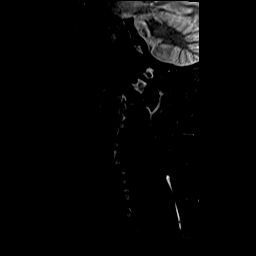
[im 16/16]
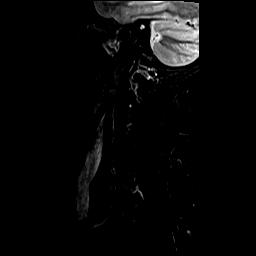

[Series 5: t2_cor_hbw · coronal · 4.0mm · 0.82mm/px · 6 of 16 slices shown]
[im 1/16]
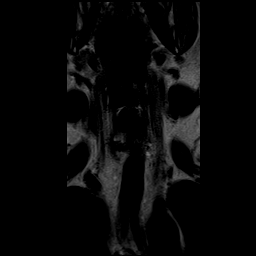
[im 4/16]
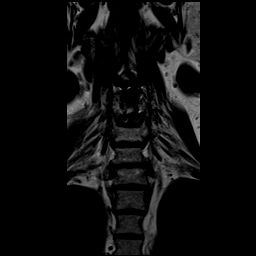
[im 7/16]
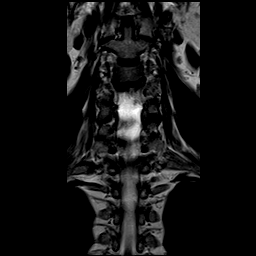
[im 10/16]
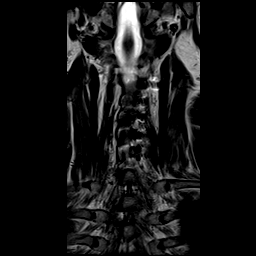
[im 13/16]
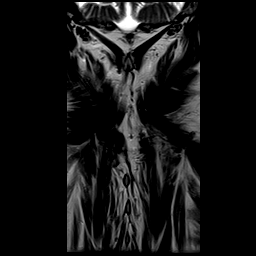
[im 16/16]
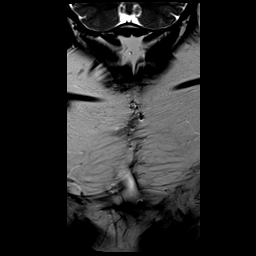

[Series 6: t2_axial_hbw · axial · 3.0mm · 0.43mm/px · z∈[-100,+20]mm · 13 of 33 slices shown]
[im 1/33]
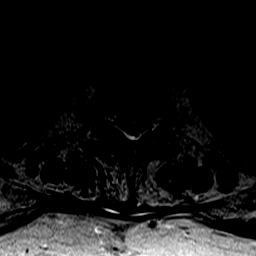
[im 3/33]
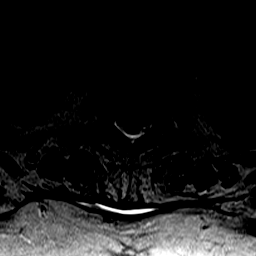
[im 6/33]
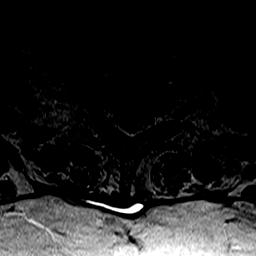
[im 9/33]
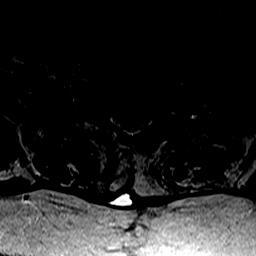
[im 11/33]
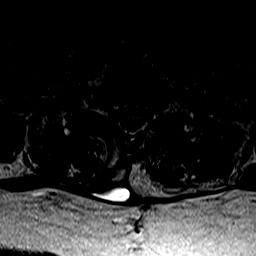
[im 14/33]
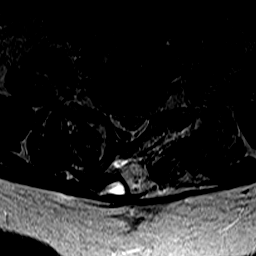
[im 17/33]
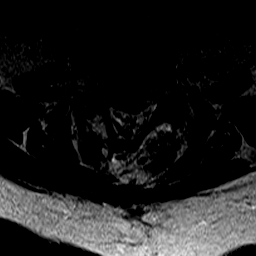
[im 19/33]
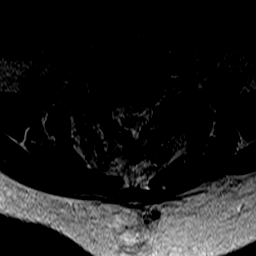
[im 22/33]
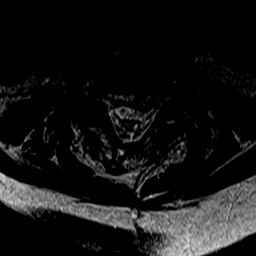
[im 25/33]
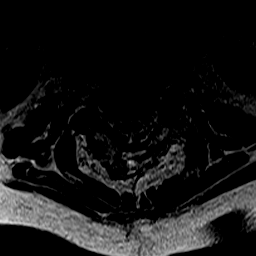
[im 27/33]
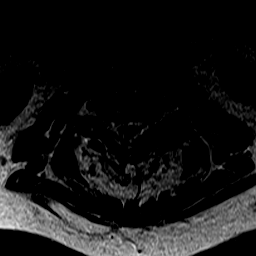
[im 30/33]
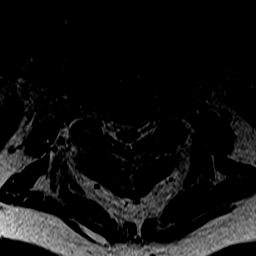
[im 33/33]
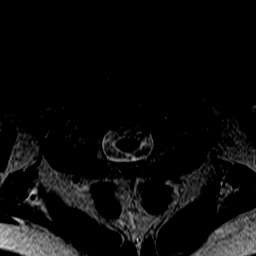

[48 of 48 positions shown; findings below may reference images not displayed]

FINDINGS: ALIGNMENT: Straightening of the cervical lordosis. No subluxations.

VERTEBRAE:  Previous ACDF at C4-5. Previous posterior decompression at C3-7. Mild height loss and increased diameter of the C3,, C6 and C7 vertebral bodies most likely degenerative in etiology. Unfused vertebral bodies demonstrate multilevel degenerative endplate signal changes.

INTERVERTEBRAL DISCS:  Unfused discs demonstrate multilevel disc desiccation with mild height loss at C6-7 and C7-T1.

PARASPINAL SOFT TISSUES:  Postoperative signal changes in the posterior soft tissues of the cervical spine.

SPINAL CORD:  There is abnormal signal and myelomalacia in the spinal cord at C4-5, likely related to previous chronic compression.

VISUALIZED POSTERIOR FOSSA: Unremarkable.

Findings by level:

C2-C3:  No high-grade canal stenosis or foraminal narrowing.

C3-C4:  No high-grade canal stenosis or foraminal narrowing.

C4-C5:  No high-grade canal stenosis or foraminal narrowing.

C5-C6:  Uncovertebral and facet joint hypertrophy cause severe right and moderate left foraminal narrowing. No high-grade canal stenosis.

C6-C7:  Uncovertebral and facet joint hypertrophy cause moderate bilateral foraminal narrowing. No high-grade canal stenosis.

C7-T1:  Uncovertebral and facet joint hypertrophy cause moderate bilateral foraminal narrowing. No high-grade canal stenosis.
IMPRESSION: 1.
Postoperative changes in the cervical spine.

2.
Severe right and moderate left C5-6 foraminal narrowing.

3.
Moderate bilateral C6-7 and C7-T1 foraminal narrowing.

## 2021-04-08 IMAGING — MR MRI TSPINE WO CONTRAST
7 series · 37 of 48 positions shown · non-contrast
Comparison: Cervical spine MRI 03/22/2021

INDICATION: Abnormal gait, muscle weakness.
TECHNIQUE: Multiplanar, multiecho imaging of the thoracic spine was performed, including T1-weighted and fluid sensitive sequences without intravenous contrast.

[Series 2: t2_cor_loc · coronal · 8.0mm · 0.68mm/px · 3 of 9 slices shown]
[im 1/9]
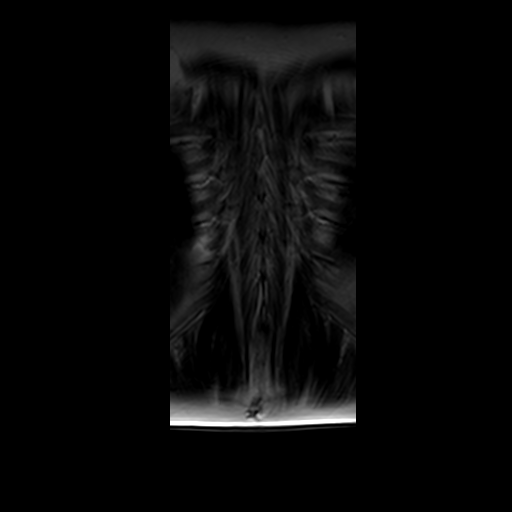
[im 5/9]
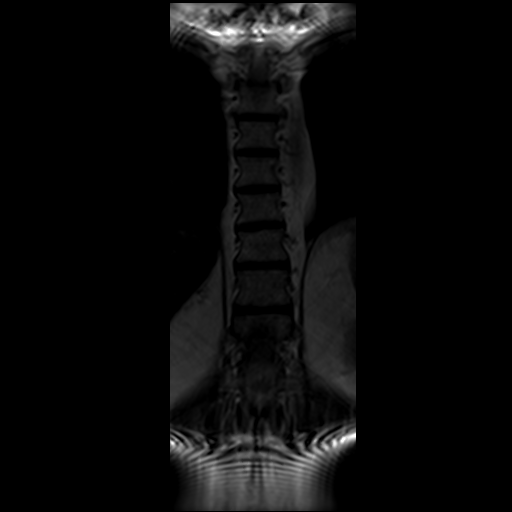
[im 9/9]
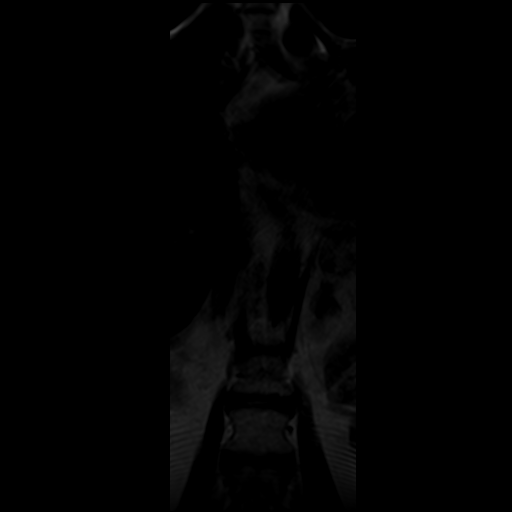

[Series 3: t1_sag_count · sagittal · 4.0mm · 0.94mm/px · 3 of 11 slices shown]
[im 1/11]
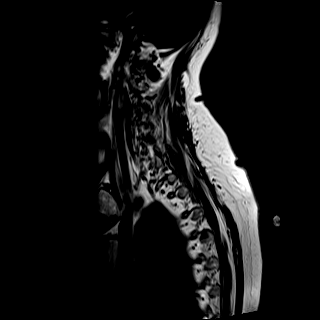
[im 6/11]
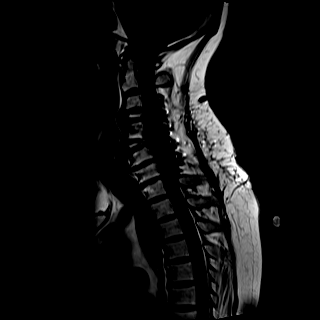
[im 11/11]
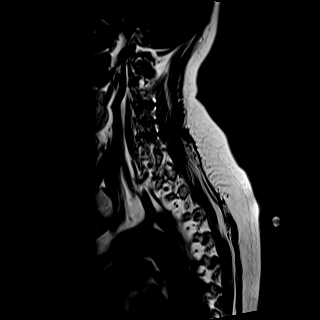

[Series 4: t2_sag · sagittal · 4.0mm · 0.86mm/px · 6 of 14 slices shown]
[im 1/14]
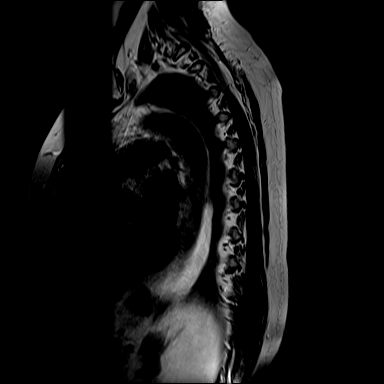
[im 3/14]
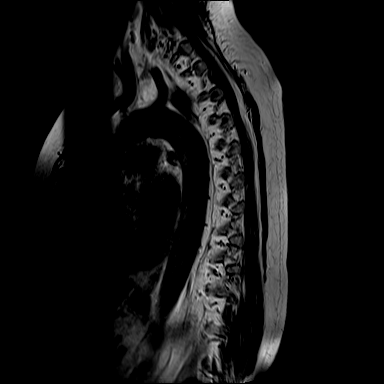
[im 6/14]
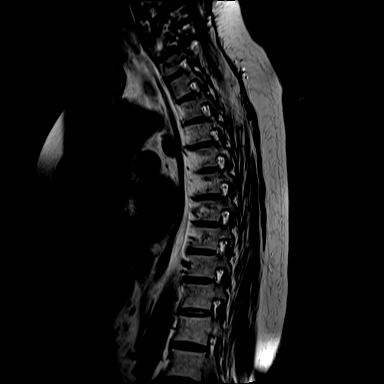
[im 8/14]
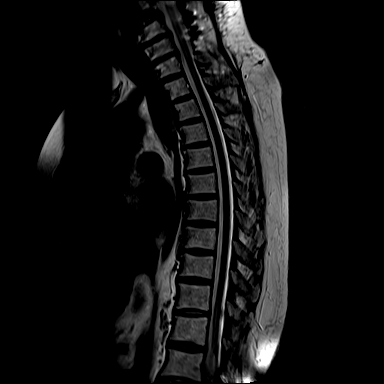
[im 11/14]
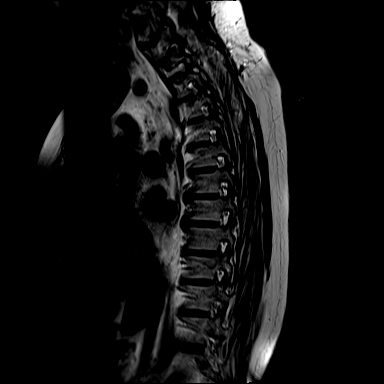
[im 14/14]
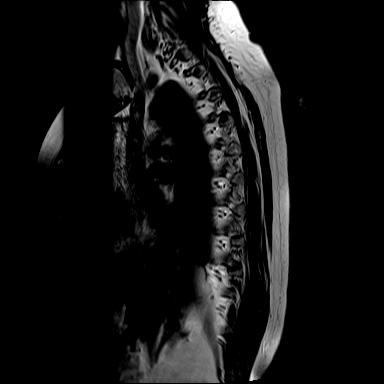

[Series 5: t1_sag · sagittal · 4.0mm · 1.03mm/px · 6 of 14 slices shown]
[im 1/14]
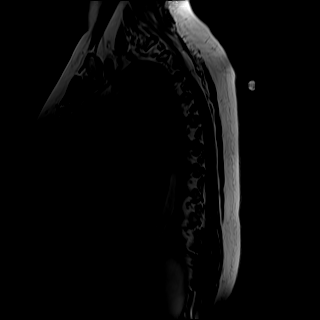
[im 3/14]
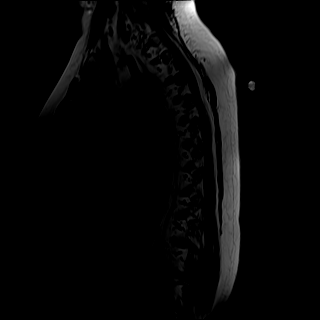
[im 6/14]
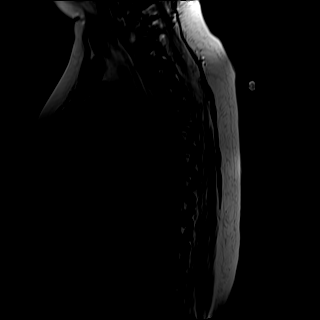
[im 8/14]
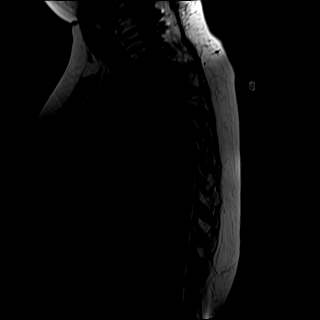
[im 11/14]
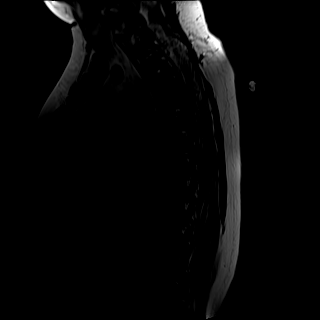
[im 14/14]
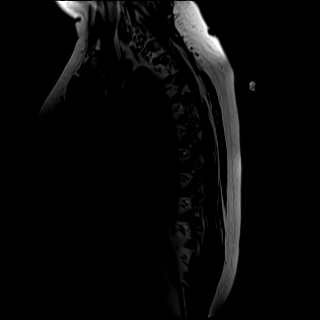

[Series 6: ir_sag · sagittal · 4.0mm · 1.29mm/px · 6 of 14 slices shown]
[im 1/14]
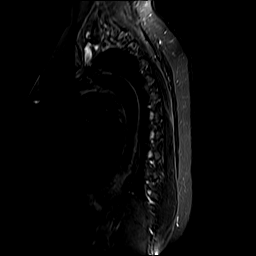
[im 3/14]
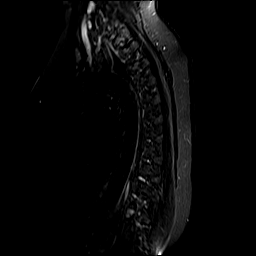
[im 6/14]
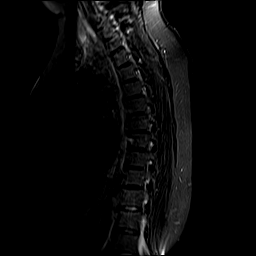
[im 8/14]
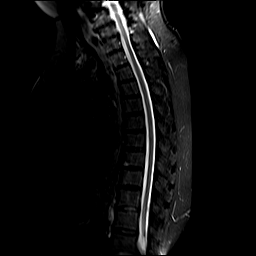
[im 11/14]
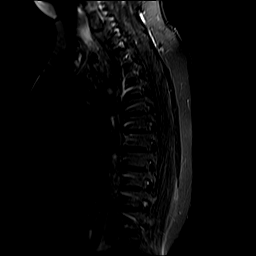
[im 14/14]
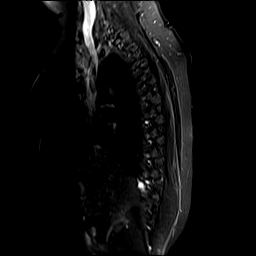

[Series 7: t2_axial_upper · axial · 4.0mm · 0.62mm/px · z∈[-121,+28]mm · 8 of 30 slices shown]
[im 1/30]
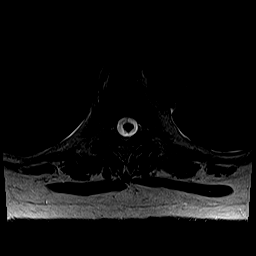
[im 6/30]
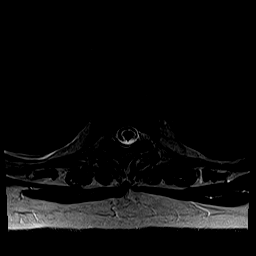
[im 8/30]
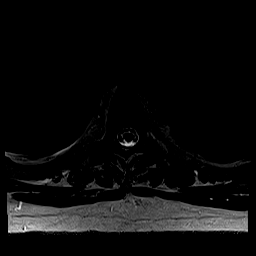
[im 14/30]
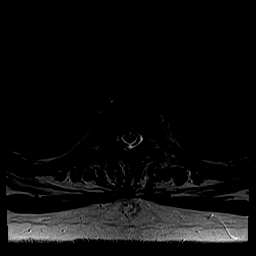
[im 16/30]
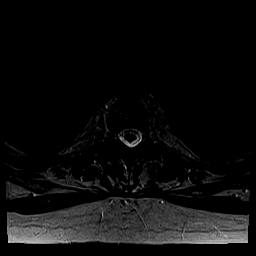
[im 22/30]
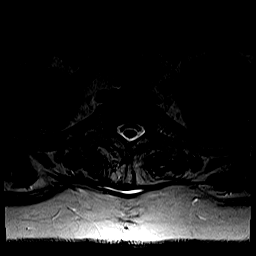
[im 24/30]
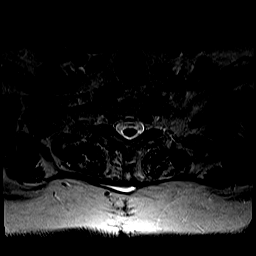
[im 30/30]
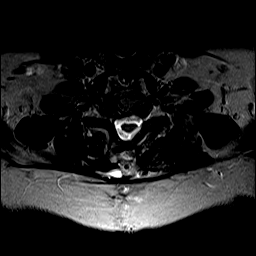

[Series 8: t2_axial_lower · axial · 4.0mm · 0.29mm/px · z∈[-233,-154]mm · 5 of 30 slices shown]
[im 1/30]
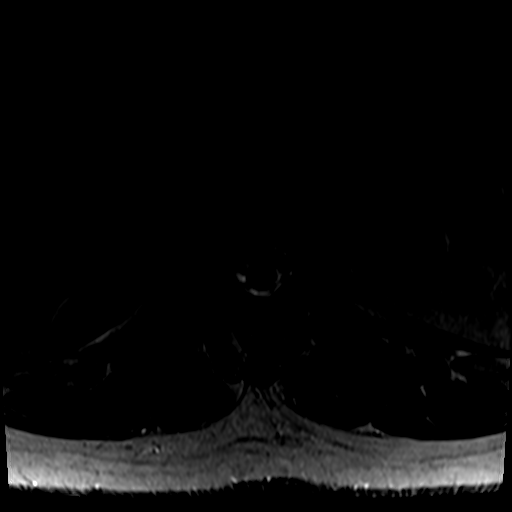
[im 6/30]
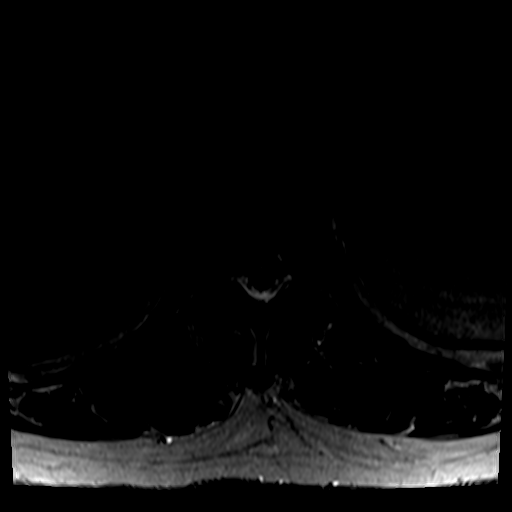
[im 8/30]
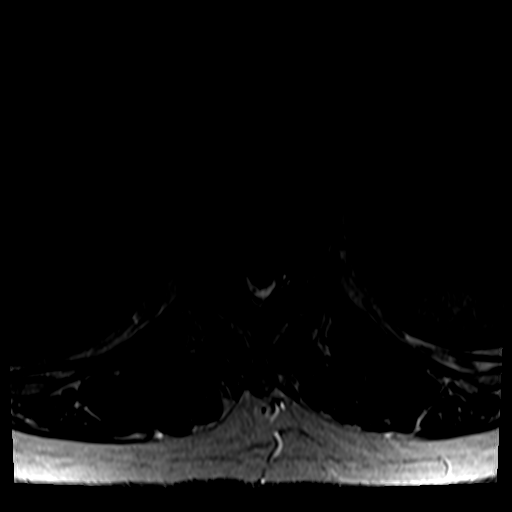
[im 14/30]
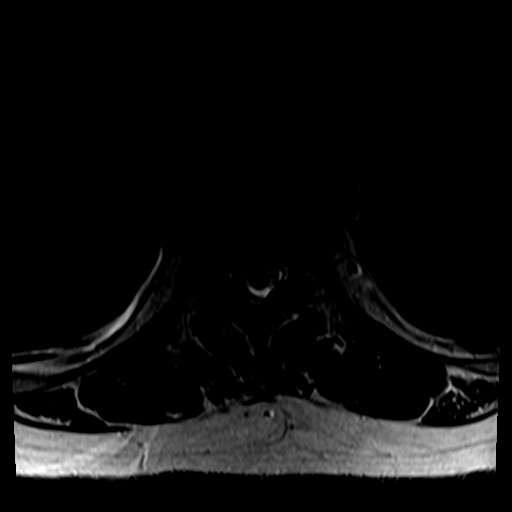
[im 16/30]
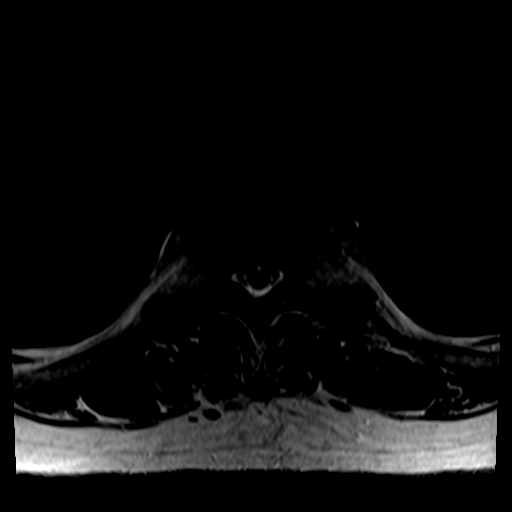

[37 of 48 positions shown; findings below may reference images not displayed]

FINDINGS: The visualized spinal cord is of normal caliber and signal intensity. 

No acute fracture. Minimal scattered discogenic endplate reactive marrow changes. Postop change noted in the cervical spine, partially imaged. No listhesis. Mild exaggeration of thoracic kyphosis.

No disc protrusion. No canal or foraminal stenosis. Mild scattered facet arthrosis of the thoracic spine.

Visualized soft tissues: 22 mm left adrenal gland nodule, partially imaged.

Partially imaged ACDF hardware at C4-C5. Mild disc disease at C5-C6 and C6-C7 with mild-moderate foraminal stenoses.
IMPRESSION: 1. No disc protrusion, canal stenosis, or foraminal stenosis.

2. Mild exaggeration of thoracic kyphosis.

3. Mild scattered facet arthrosis of the thoracic spine.

4. Mild disc disease at C5-C7 with mild-moderate foraminal stenoses.

5. Incidental 22 mm left adrenal gland nodule. Recommend CT abdomen with and without contrast (adrenal protocol).

## 2021-04-08 IMAGING — MR MRI LSPINE WO CONTRAST
6 series · 38 of 48 positions shown · non-contrast
Comparison: Lumbar spine MRI 10/05/2020, lumbar spine radiographs 10/05/2020.

INDICATION: Abnormal gait, muscle weakness.
TECHNIQUE: Multiplanar, multiecho MR imaging of the lumbar spine was performed, including T1-weighted and fluid sensitive sequences without intravenous contrast.

[Series 1: bSSFP · axial · 8.0mm · 1.37mm/px · z∈[-61,+175]mm · 9 of 25 slices shown]
[im 1/25]
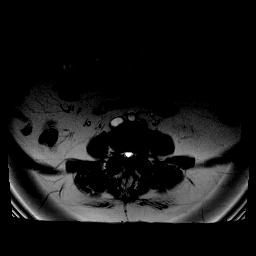
[im 4/25]
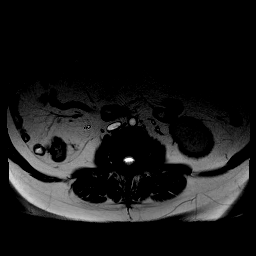
[im 7/25]
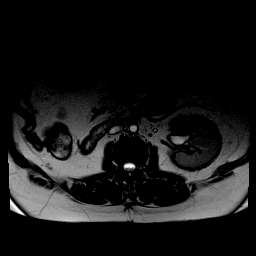
[im 10/25]
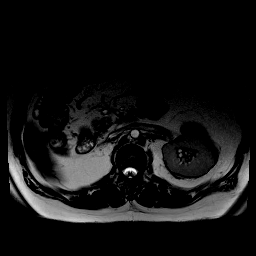
[im 13/25]
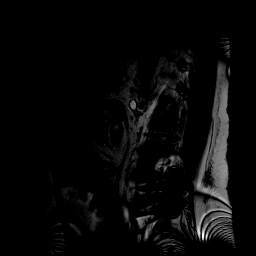
[im 16/25]
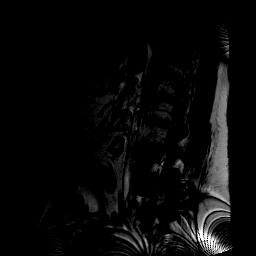
[im 19/25]
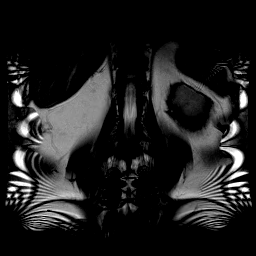
[im 22/25]
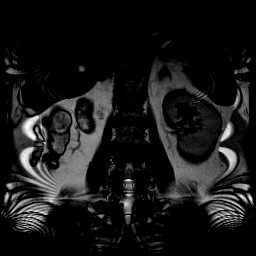
[im 25/25]
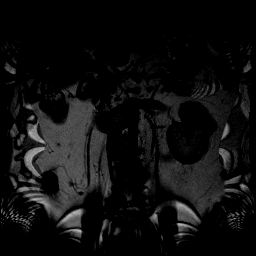

[Series 2: t2_sag_hbw · sagittal · 4.0mm · 0.47mm/px · 5 of 14 slices shown]
[im 1/14]
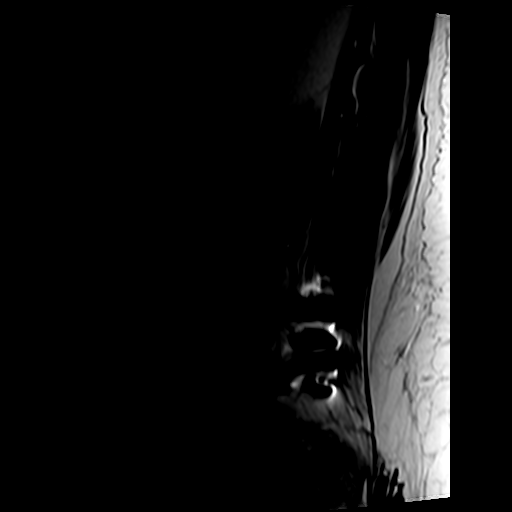
[im 4/14]
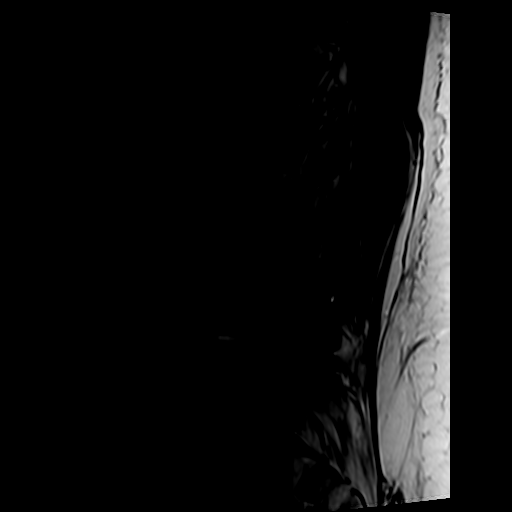
[im 7/14]
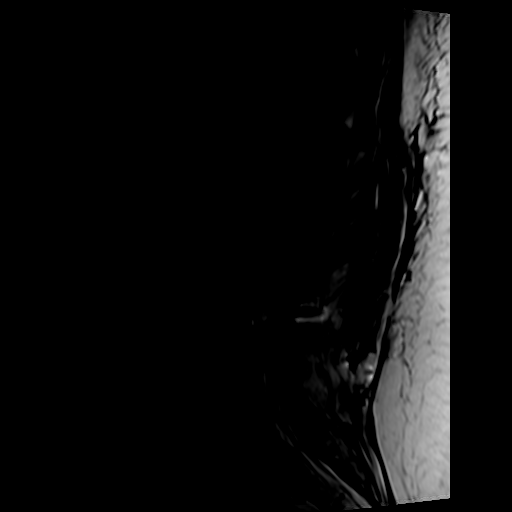
[im 10/14]
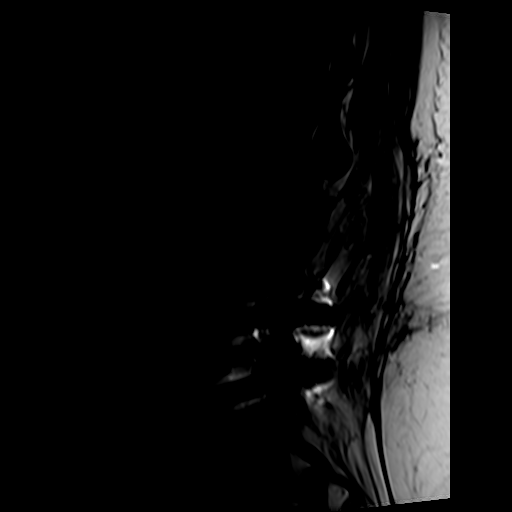
[im 14/14]
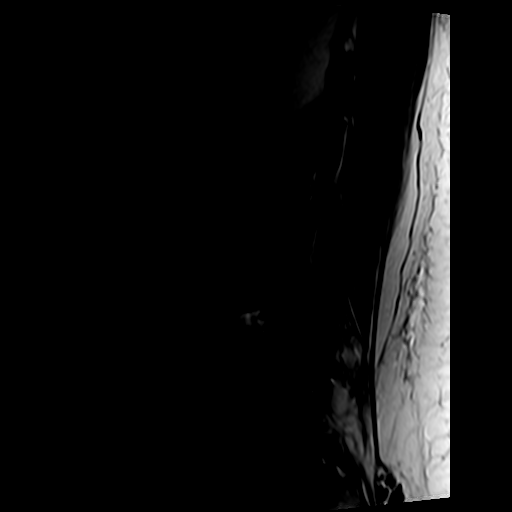

[Series 3: ir_sag_hbw · sagittal · 4.0mm · 0.47mm/px · 6 of 14 slices shown]
[im 1/14]
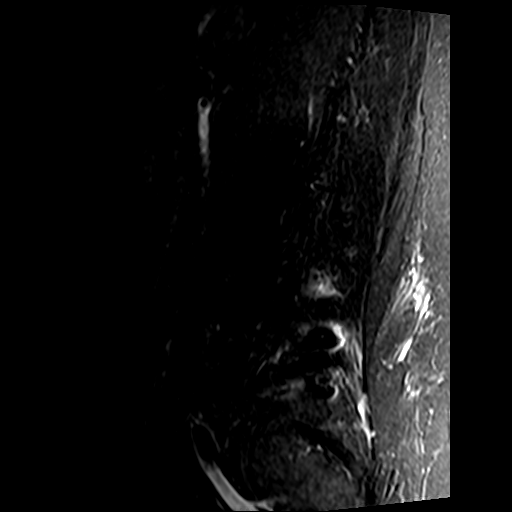
[im 3/14]
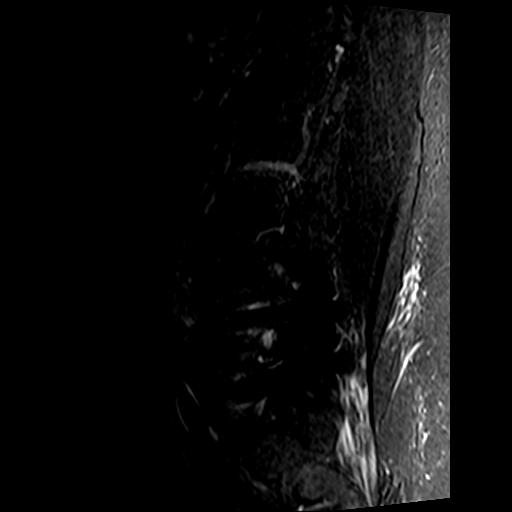
[im 6/14]
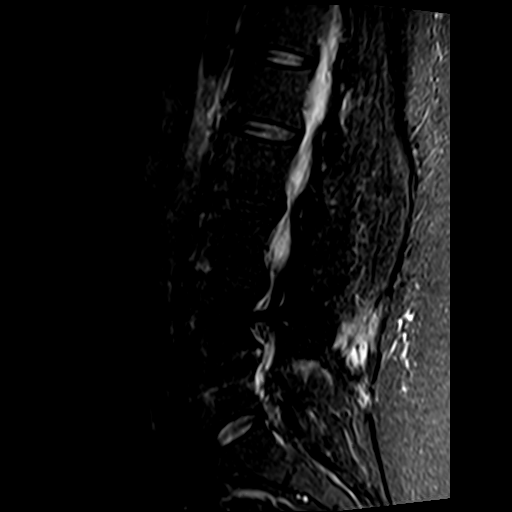
[im 8/14]
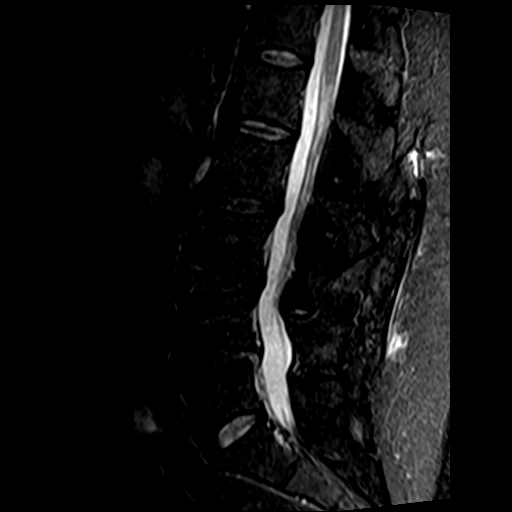
[im 11/14]
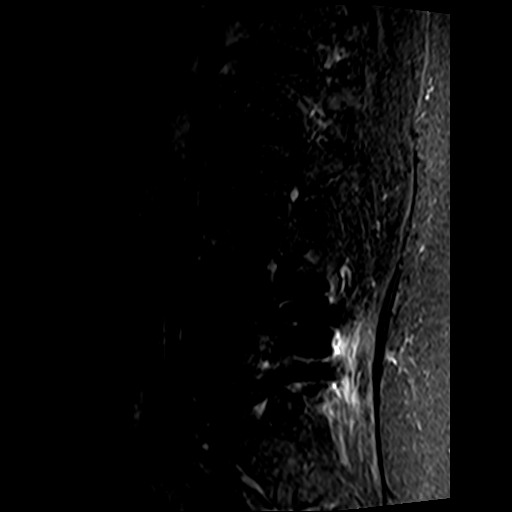
[im 14/14]
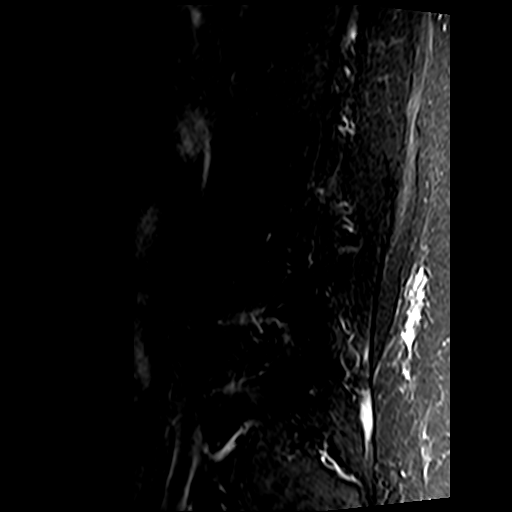

[Series 4: t1_sag_hbw · sagittal · 4.0mm · 0.94mm/px · 6 of 14 slices shown]
[im 1/14]
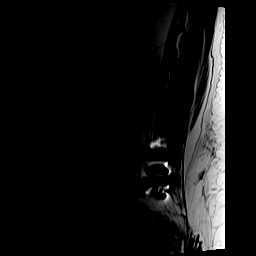
[im 3/14]
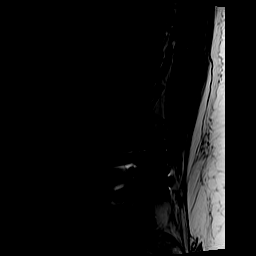
[im 6/14]
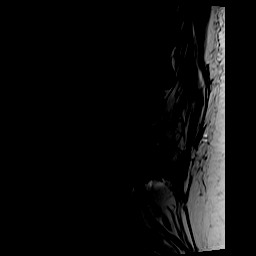
[im 8/14]
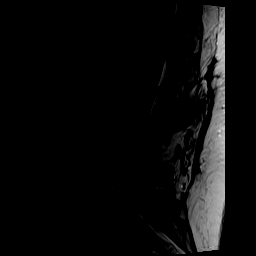
[im 11/14]
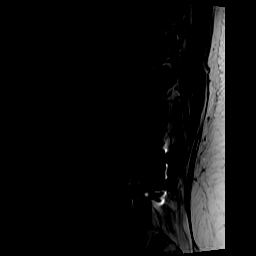
[im 14/14]
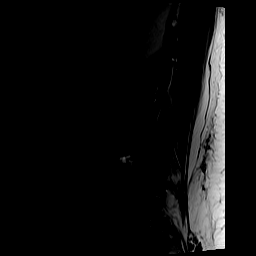

[Series 5: t1_axial_obl_hbw · axial · 4.0mm · 0.86mm/px · z∈[-183,+39]mm · 8 of 25 slices shown]
[im 1/25]
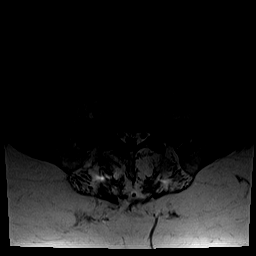
[im 3/25]
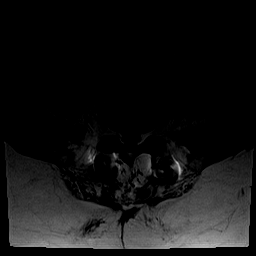
[im 9/25]
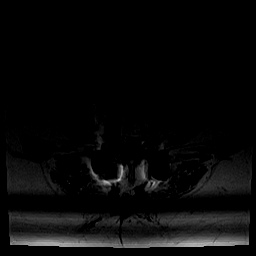
[im 11/25]
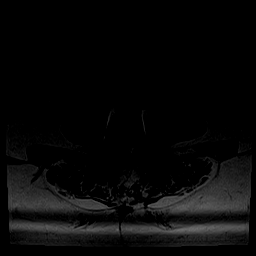
[im 14/25]
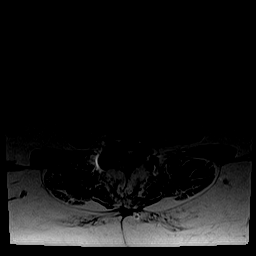
[im 17/25]
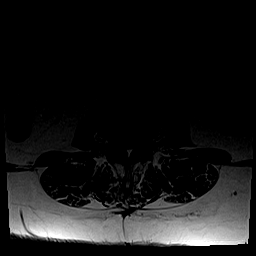
[im 22/25]
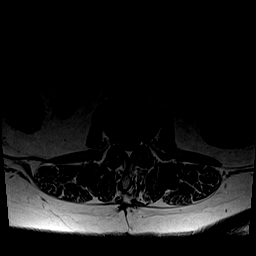
[im 25/25]
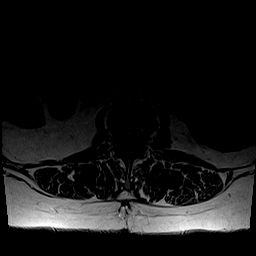

[Series 6: t2_axial_hbw · axial · 4.0mm · 0.86mm/px · z∈[-153,-88]mm · 4 of 30 slices shown]
[im 1/30]
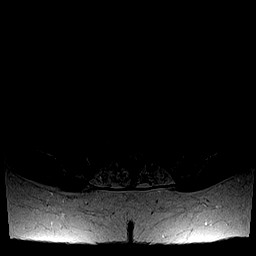
[im 6/30]
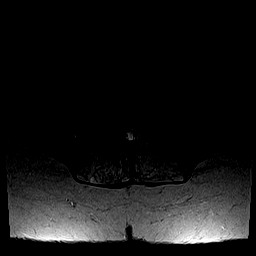
[im 8/30]
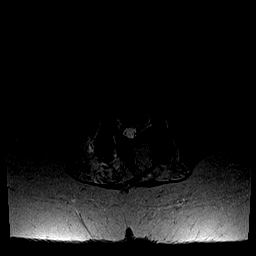
[im 14/30]
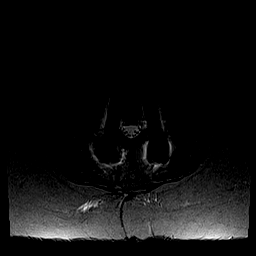

[38 of 48 positions shown; findings below may reference images not displayed]

FINDINGS: Conus: The conus is normal in appearance and position.  

Alignment: Mild retrolisthesis at L2-L3 and L3-L4. 

Marrow: No acute fracture. Status post L4-5 posterior fusion with L4 laminectomy. Mild discogenic endplate reactive marrow changes at L2-L4 .

T12-L1: No disc protrusion. No canal or foraminal stenosis. No facet arthrosis.

L1-L2: No disc protrusion, canal stenosis, or foraminal stenosis.

L2-L3: Mild disc bulge. Mild retrolisthesis. No facet arthrosis. Mild bilateral foraminal stenosis. Mild canal stenosis.

L3-L4: Mild retrolisthesis. Mild disc bulge. Mild bilateral facet arthrosis. Mild canal stenosis. Moderate left foraminal stenosis. Mild right foraminal stenosis.

L4-L5: L4 laminectomy. No disc protrusion. No canal or foraminal stenosis.

L5-S1: No disc protrusion, canal stenosis, or foraminal stenosis.

Visualized SI joints: Intact

Visualized soft tissues: Unremarkable
IMPRESSION: 1. Status post L4-5 posterior fusion with L4 laminectomy.

2. Mild retrolisthesis and mild disc bulge at L3-L4 with mild canal and moderate left foraminal stenosis.

3. Mild disc bulge and mild retrolisthesis at L2-L3 with mild canal and mild bilateral foraminal stenosis.

## 2021-05-25 IMAGING — MR MRI IAC W/WO CONTRAST
7 of 11 series · 28 of 48 positions shown · IV contrast (prohance)
Comparison: MRI brain 01/30/21

INDICATION: 64-year-old female with dizziness and giddiness.
TECHNIQUE: Multiplanar, multisequence MRI of the brain was performed without and with intravenous contrast following the internal auditory canal protocol. The patient received an intravenous dose of 20 mL ProHance.

[Series 1: bSSFP · axial · 4.0mm · 0.94mm/px · z∈[-20,+121]mm · 4 of 25 slices shown]
[im 1/25]
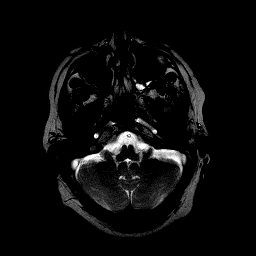
[im 9/25]
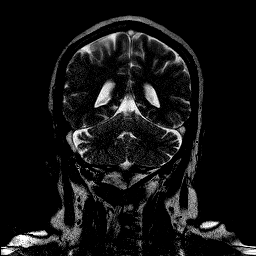
[im 17/25]
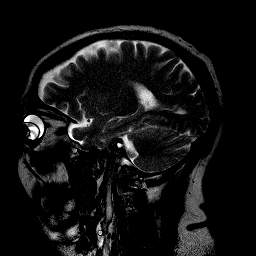
[im 25/25]
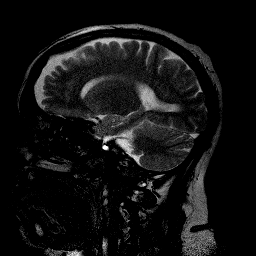

[Series 2: t2_axial_fs · axial · 0.8mm · 0.35mm/px · z∈[-29,-1]mm · 4 of 36 slices shown]
[im 1/36]
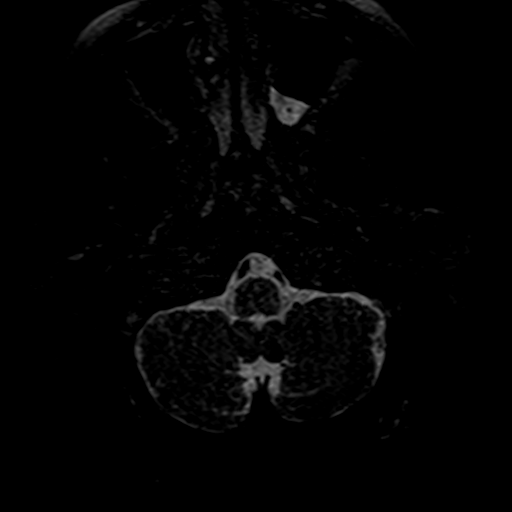
[im 12/36]
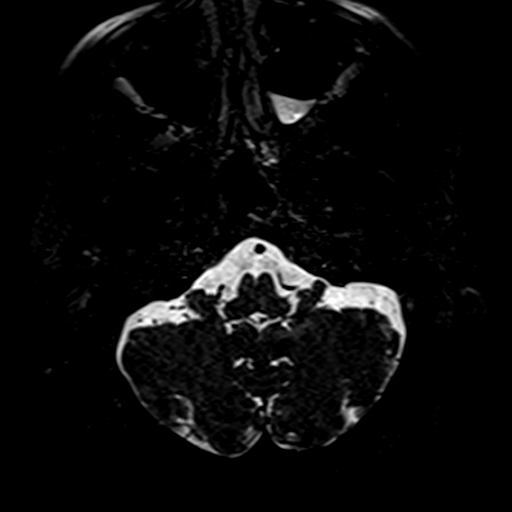
[im 24/36]
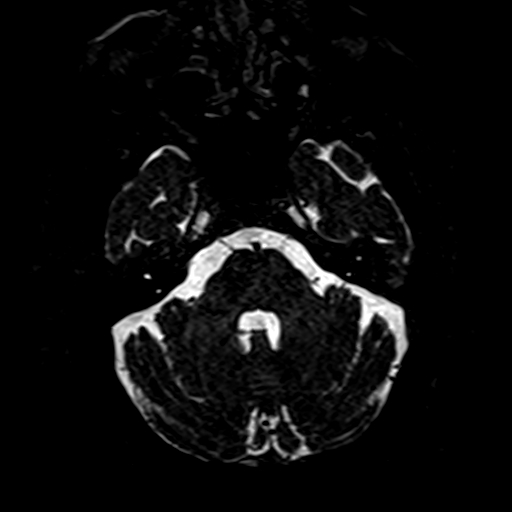
[im 36/36]
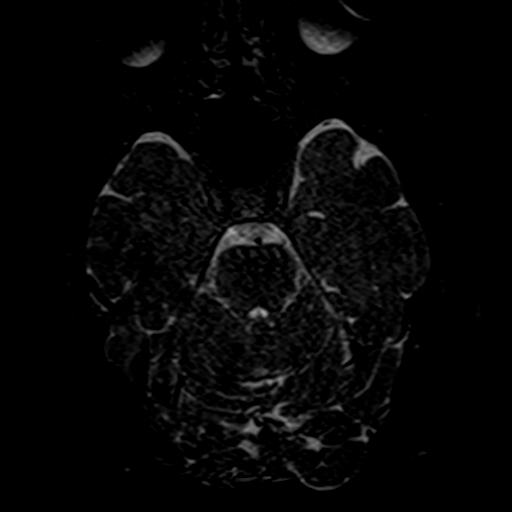

[Series 3: t2_cor_fs_reformat · coronal · 1.5mm · 0.30mm/px · 3 of 28 slices shown]
[im 1/28]
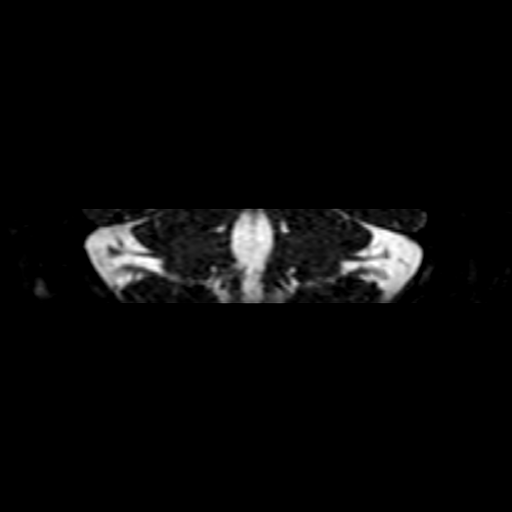
[im 14/28]
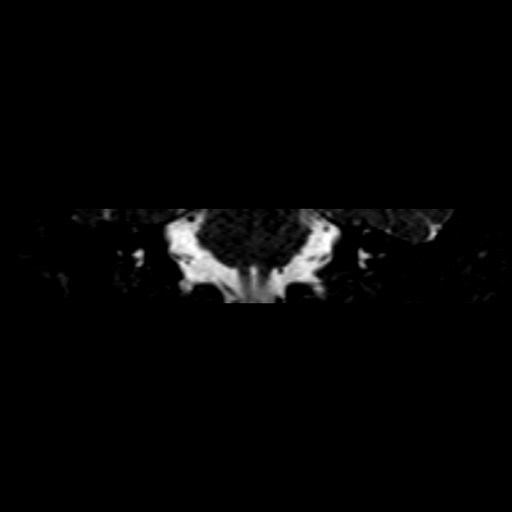
[im 28/28]
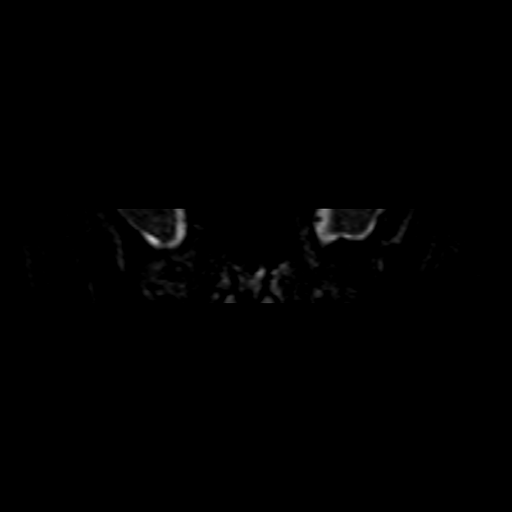

[Series 4: t1_vibe_axial_fs · axial · 1.0mm · 0.39mm/px · z∈[-32,+3]mm · 4 of 36 slices shown]
[im 1/36]
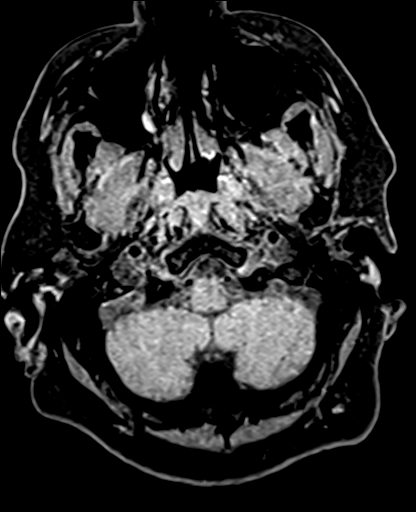
[im 12/36]
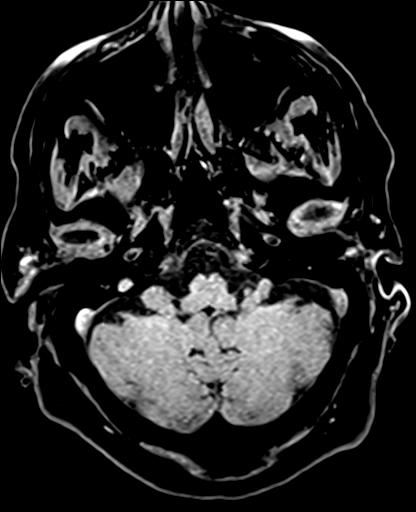
[im 24/36]
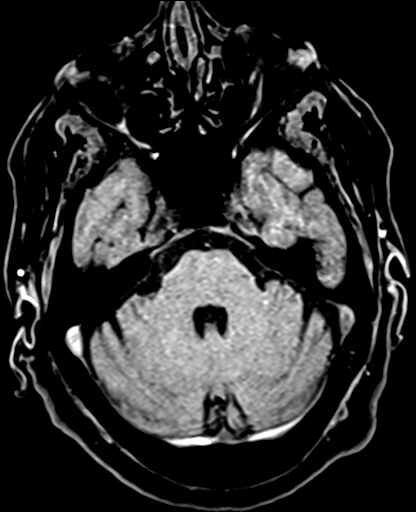
[im 36/36]
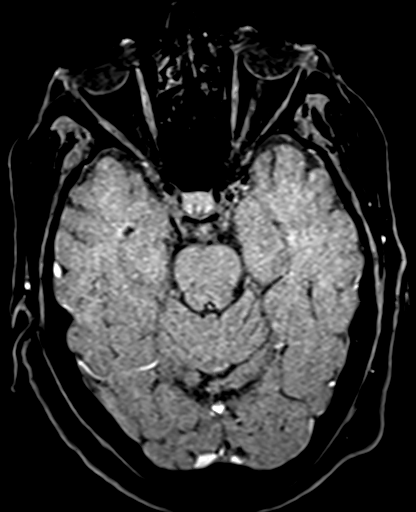

[Series 5: t1_vibe_cor_fs_reformat · coronal · 1.5mm · 0.32mm/px · 7 of 76 slices shown]
[im 1/76]
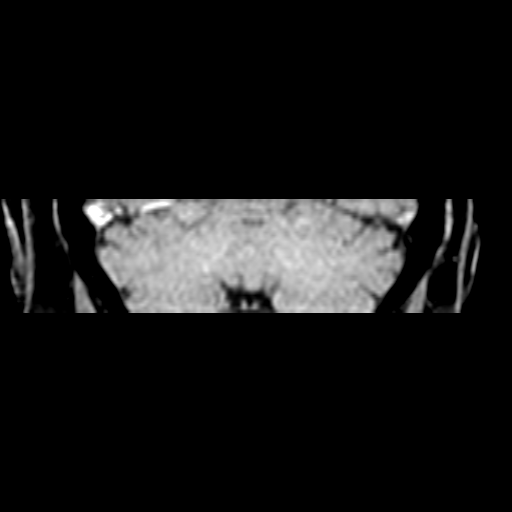
[im 10/76]
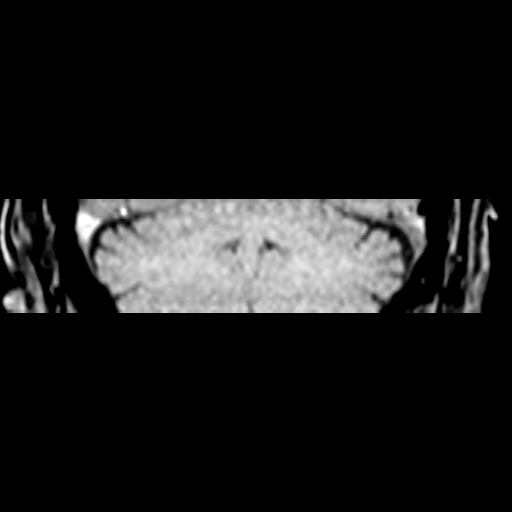
[im 19/76]
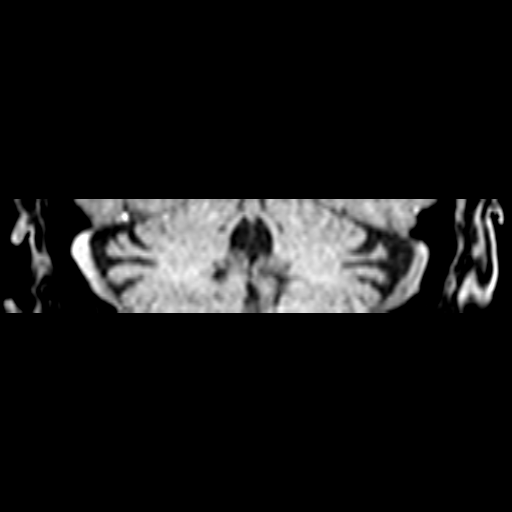
[im 29/76]
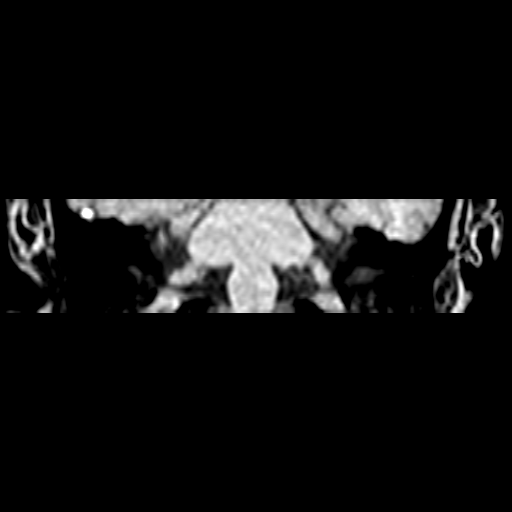
[im 47/76]
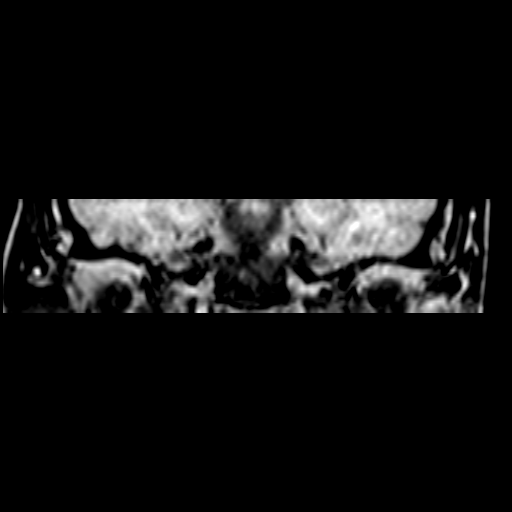
[im 57/76]
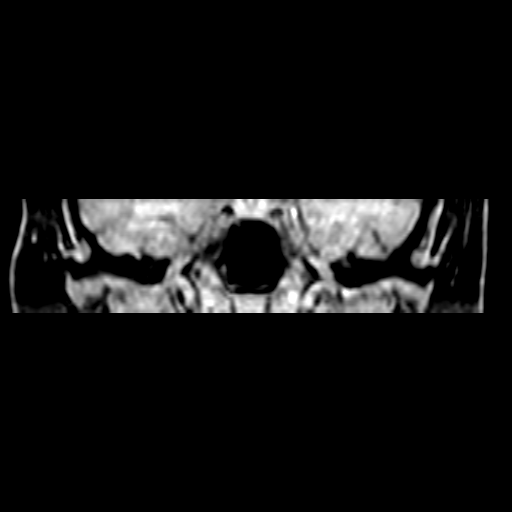
[im 66/76]
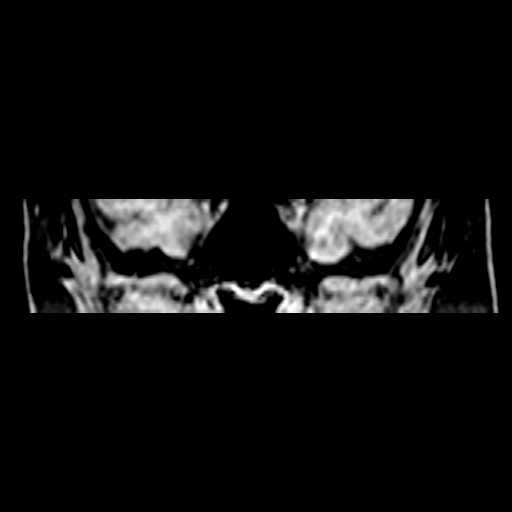

[Series 7: DWI · axial · 5.0mm · 1.26mm/px · z∈[-53,+89]mm · 3 of 23 slices shown (1 of 2)]
[im 1/23]
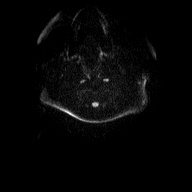
[im 12/23]
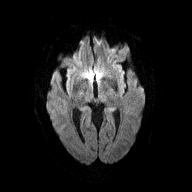
[im 23/23]
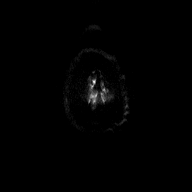

[Series 8: DWI · axial · 5.0mm · 1.26mm/px · z∈[-53,+89]mm · 3 of 23 slices shown (2 of 2)]
[im 1/23]
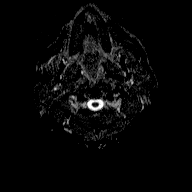
[im 12/23]
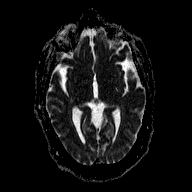
[im 23/23]
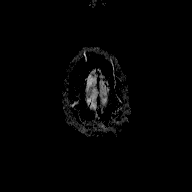

[28 of 48 positions shown; findings below may reference images not displayed]

FINDINGS: IAC/cerebellopontine angle: No cerebellopontine angle or internal auditory canal mass. No nodularity of CN VII and VIII. Normal signal of inner ear structures. No semicircular canal dehiscence. Normal enhancement of the facial nerve. No mass at the stylomastoid foramen. No abnormal enhancement.

Brain parenchyma: No acute infarct or hemorrhage.    

Ventricles: No midline shift, herniation or hydrocephalus.

Extra-axial spaces: No extra-axial fluid collection.

Extracranial structures: Small fluid level in the left maxillary sinus with frothy secretions, which may be seen with acute sinusitis. Marrow signal normal. Soft tissues normal.
IMPRESSION: 1.
No abnormality to explain patient's symptoms.

2.
Small fluid level in the left maxillary sinus with frothy secretions, which may be seen with acute sinusitis.

## 2021-12-12 IMAGING — US US SOFT TISSUE LWR EXT  LT
1 series · 15 of 15 positions shown · non-contrast
Comparison: None.

HISTORY: Localized edema.
TECHNIQUE: Soft tissue ultrasound of medial proximal left lower leg at the area of clinical concern performed.

[Series 1: us soft tissue lwr ext left · 15 of 15 slices shown]
[im 1/15]
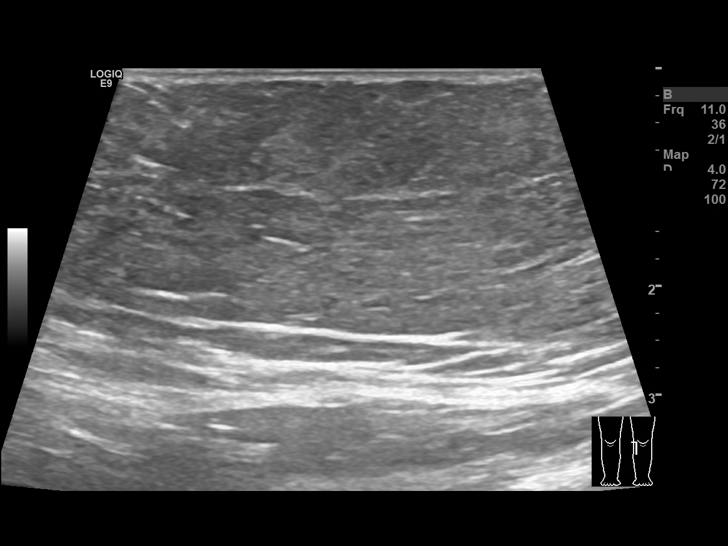
[im 2/15]
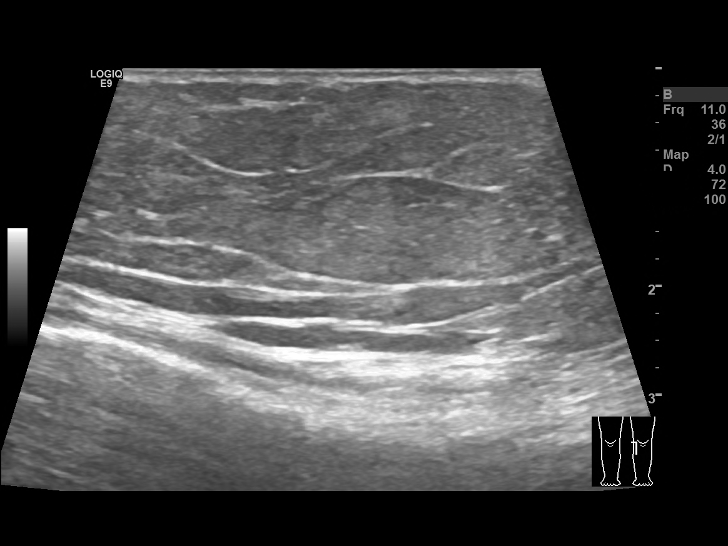
[im 3/15]
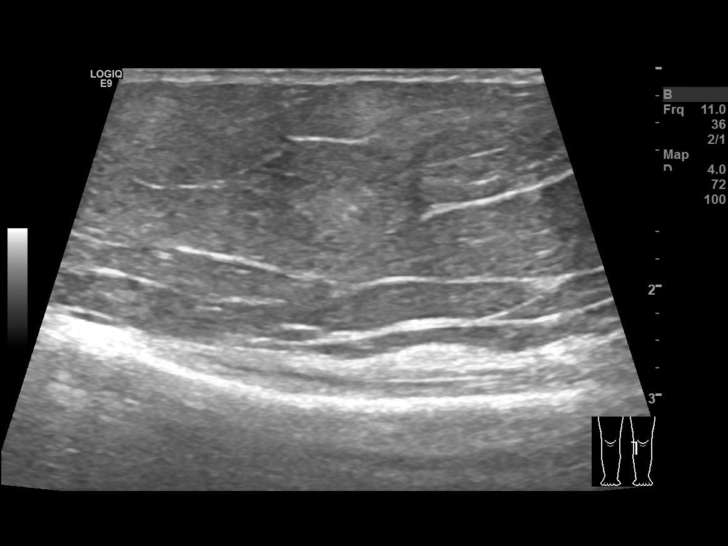
[im 4/15]
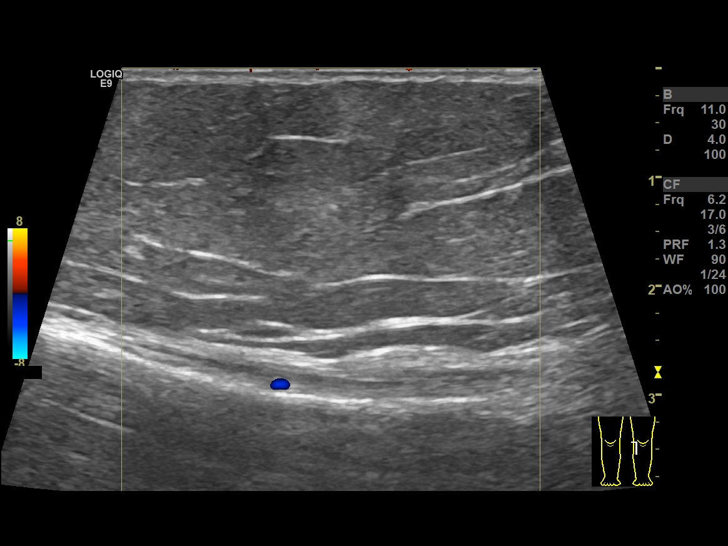
[im 5/15]
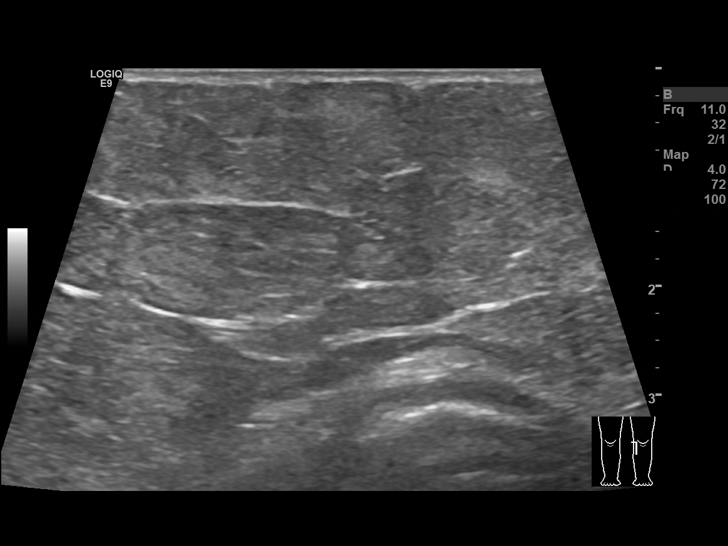
[im 6/15]
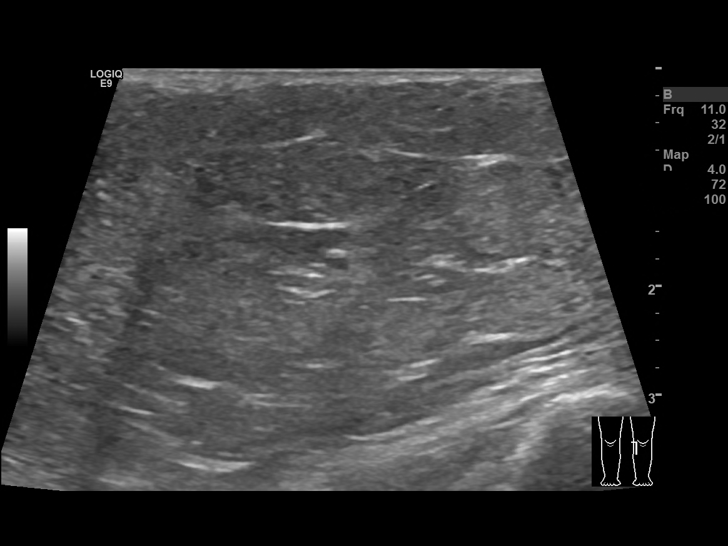
[im 7/15]
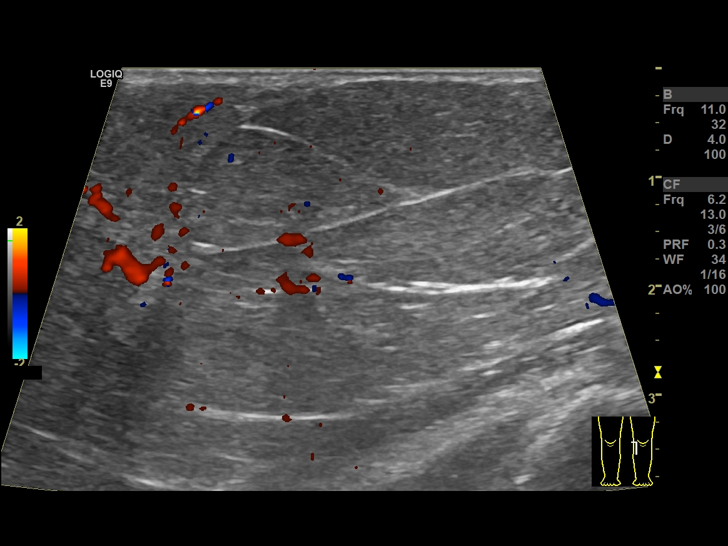
[im 8/15]
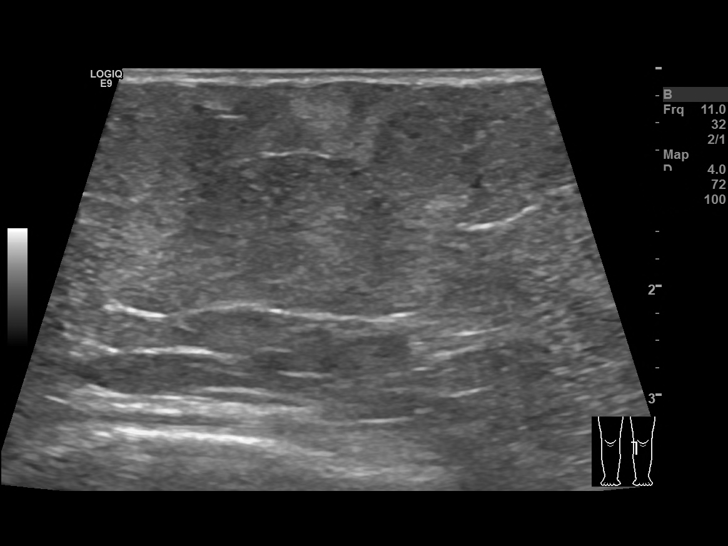
[im 9/15]
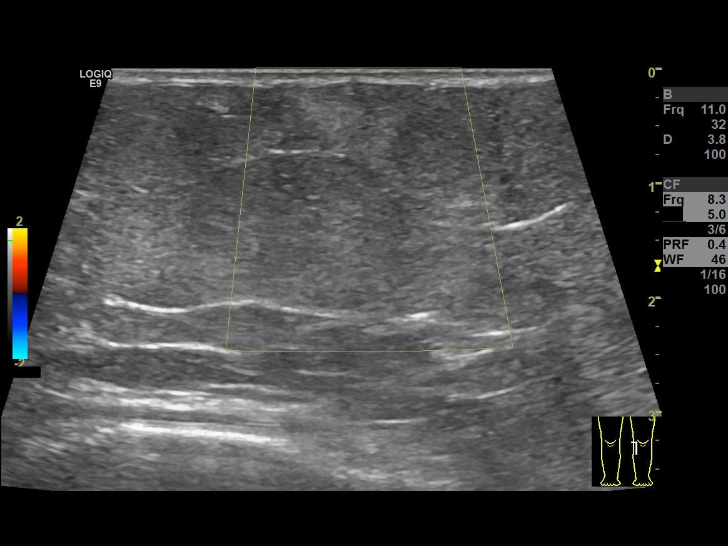
[im 10/15]
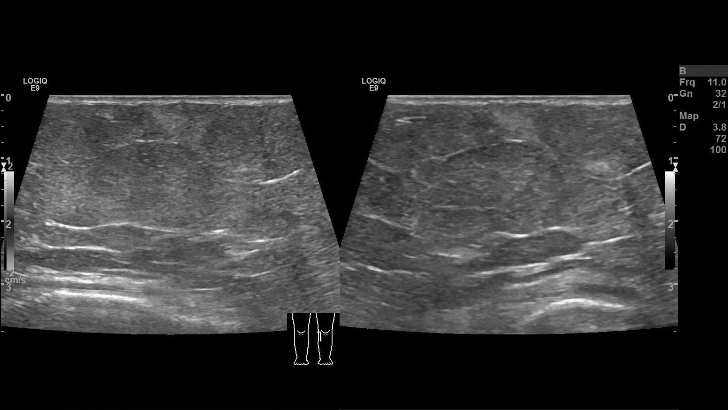
[im 11/15]
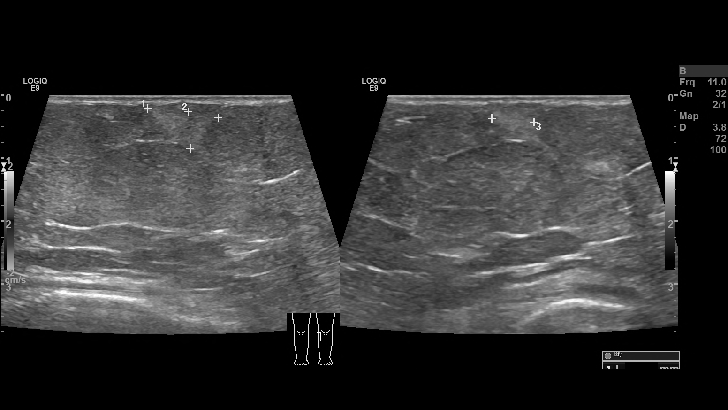
[im 12/15]
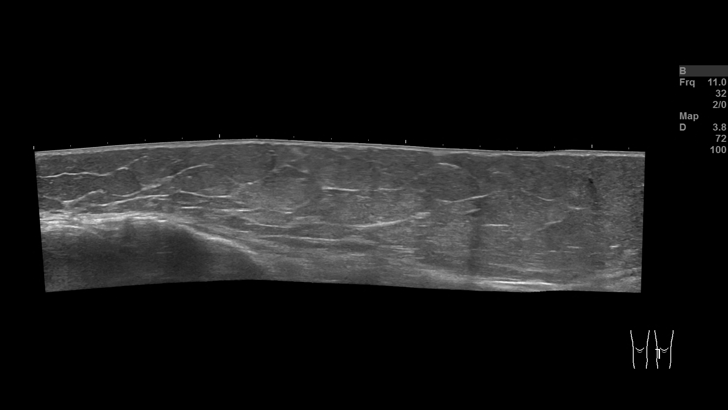
[im 13/15]
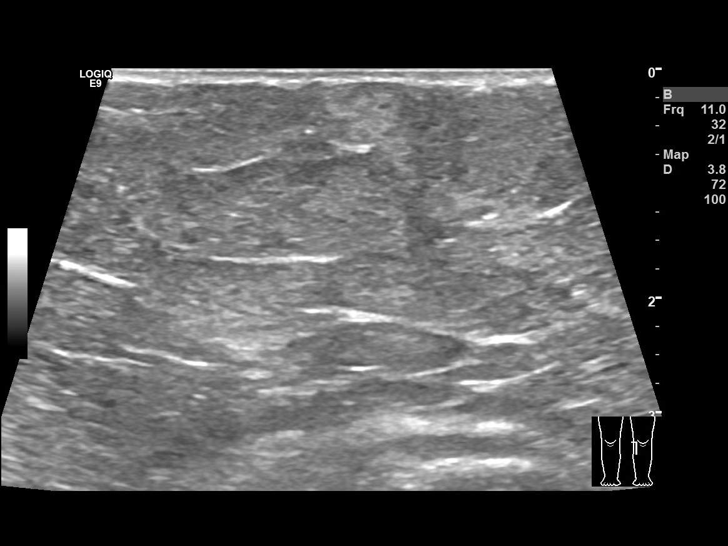
[im 14/15]
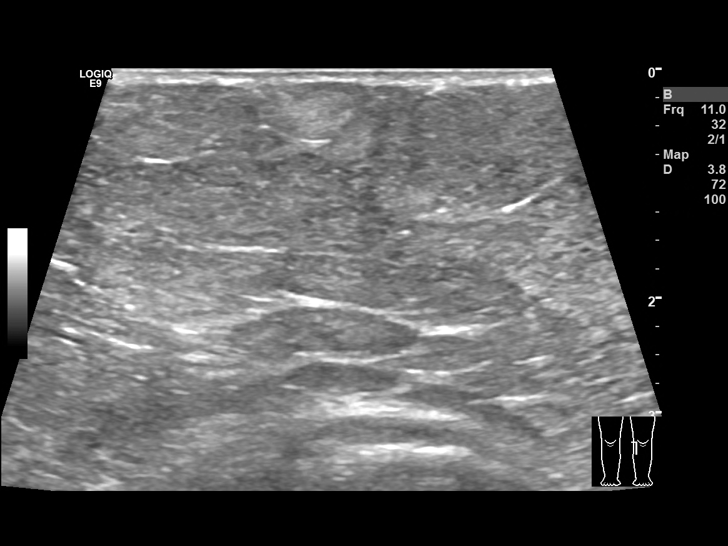
[im 15/15]
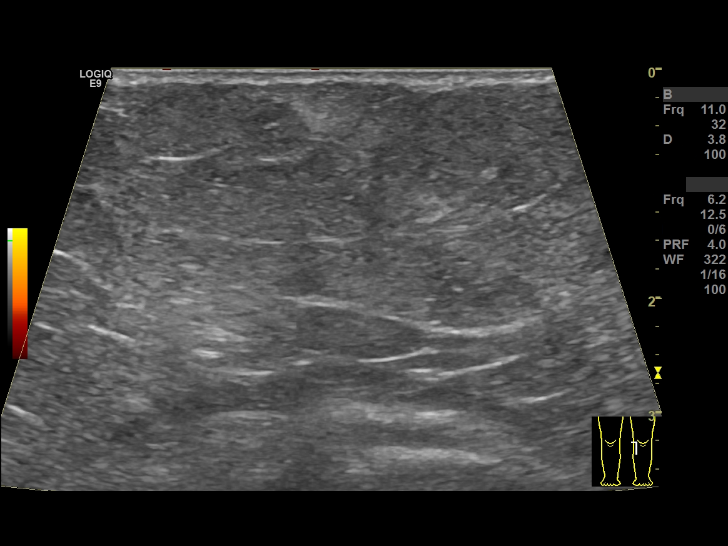

[15 of 15 positions shown; findings below may reference images not displayed]

FINDINGS: 11 x 6 x 7 mm, slightly hyperechoic area involving subcutaneous adipose tissue at the area of clinical concern identified. No other adjacent mass or cyst is identified.
IMPRESSION: 11 x 6 x 7 mm, slightly hyperechoic area involving subcutaneous adipose tissue at the area of clinical concern is seen, possibly due to focal subcutaneous adipose tissue or small lipoma. Followup ultrasound study after one-year interval or MRI study may be performed.

## 2022-01-02 IMAGING — MG MAMMO SCRN BIL W/CAD TOMO
8 series · 9 of 24 positions shown · non-contrast
Comparison: The present examination has been compared to prior imaging studies.

Images Obtained from [facility]
INDICATION: Screening.
TECHNIQUE: Bilateral 2-D digital screening mammogram was performed followed by 3-D tomosynthesis.  Current study was also evaluated with a computer aided detection (CAD) system.
MAMMOGRAM FINDINGS:
There are scattered areas of fibroglandular density.
No suspicious abnormality is seen in either breast.  There are no significant changes from the prior study.

[L MLO]
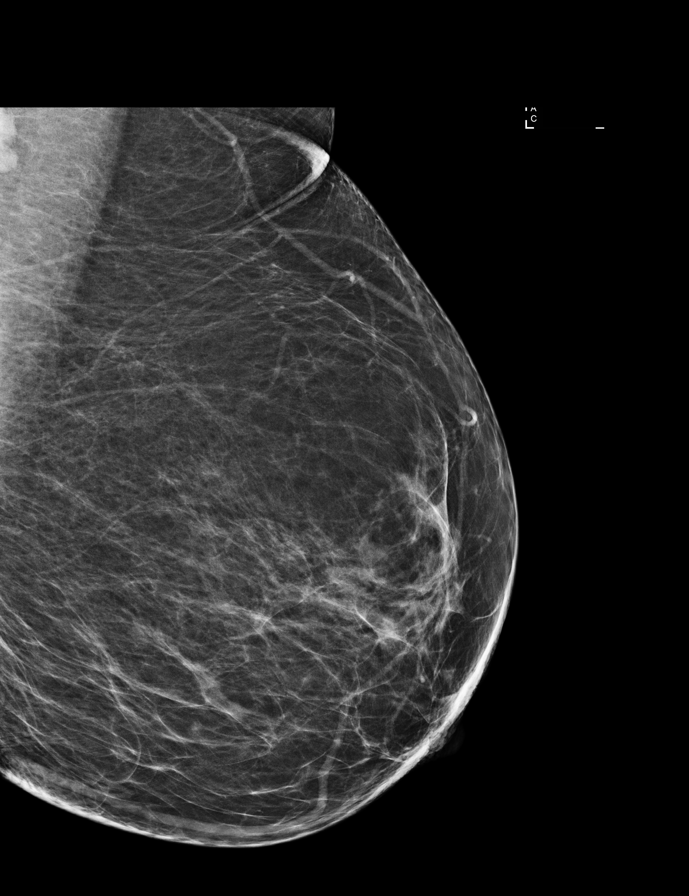

[L CC]
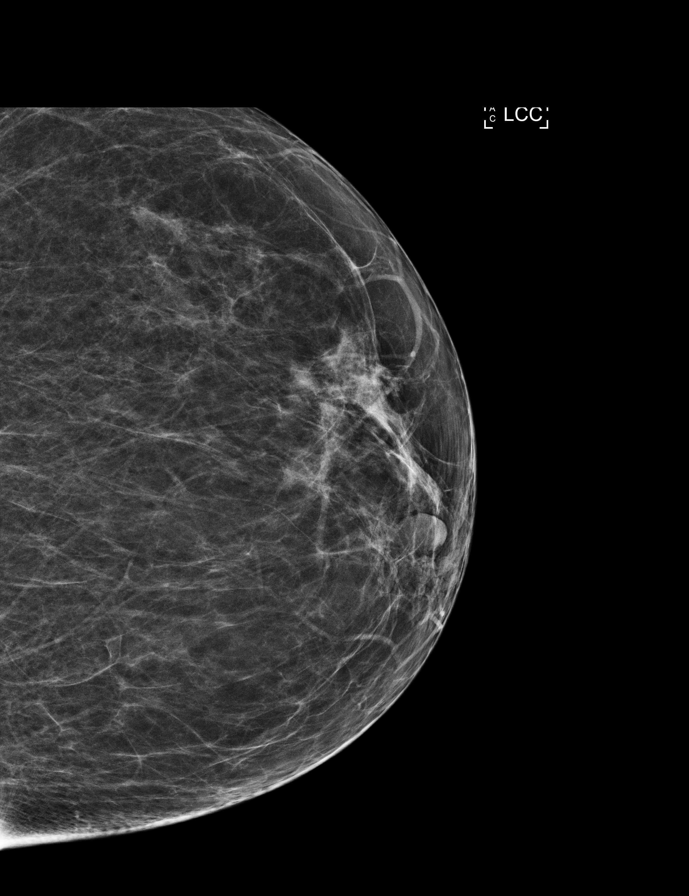

[R CC]
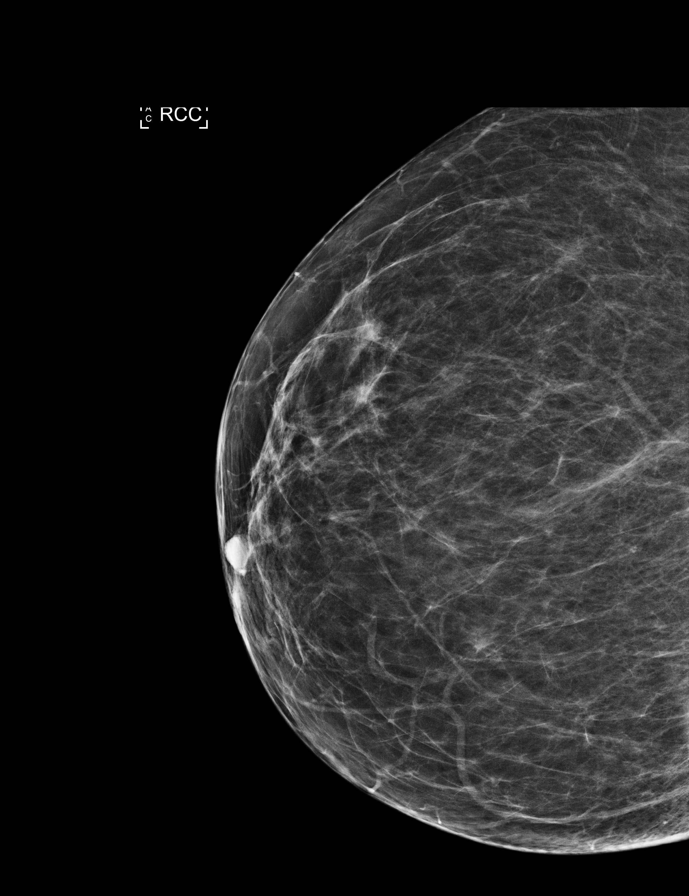

[R MLO]
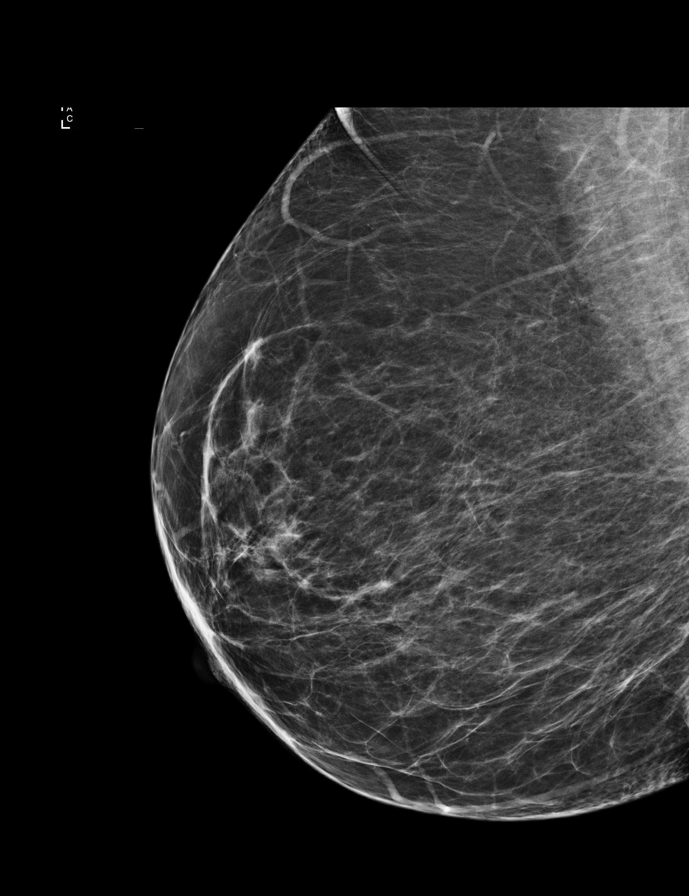

[R MLO tomo · 2 of 62 frames shown]
[frame 21/62]
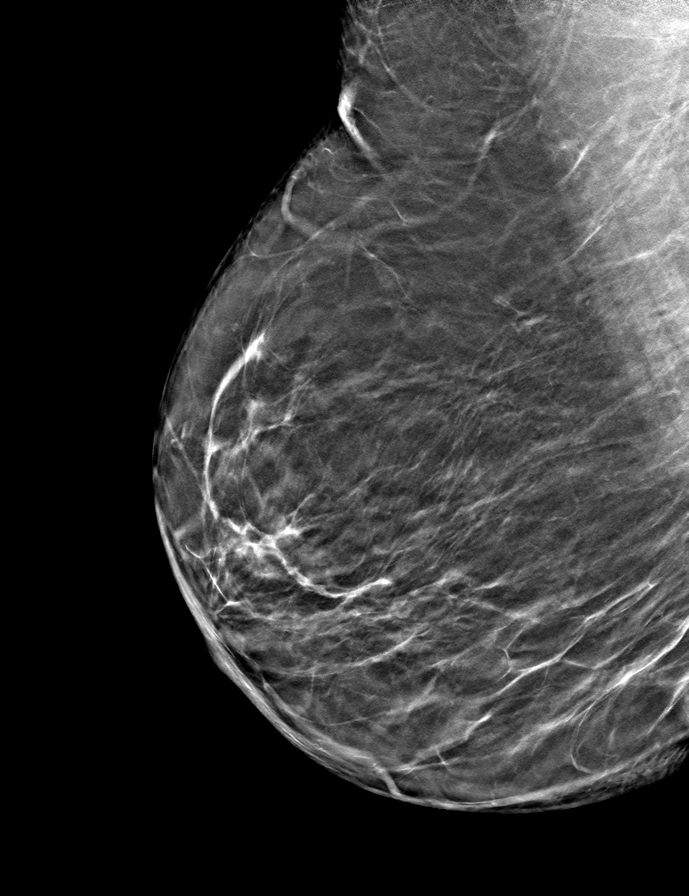
[frame 31/62]
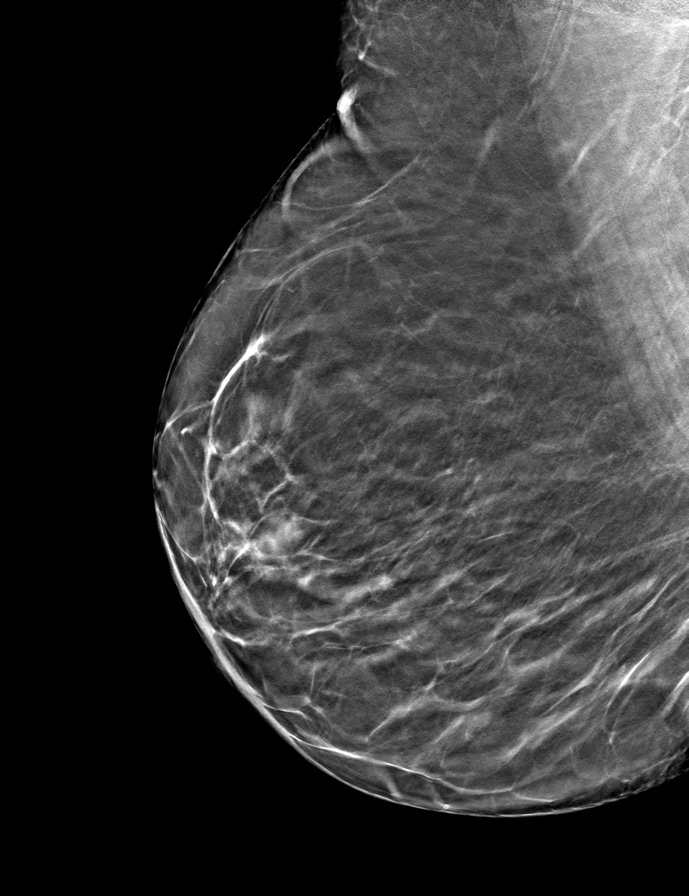

[L CC tomo · tomo slice 27/54.0]
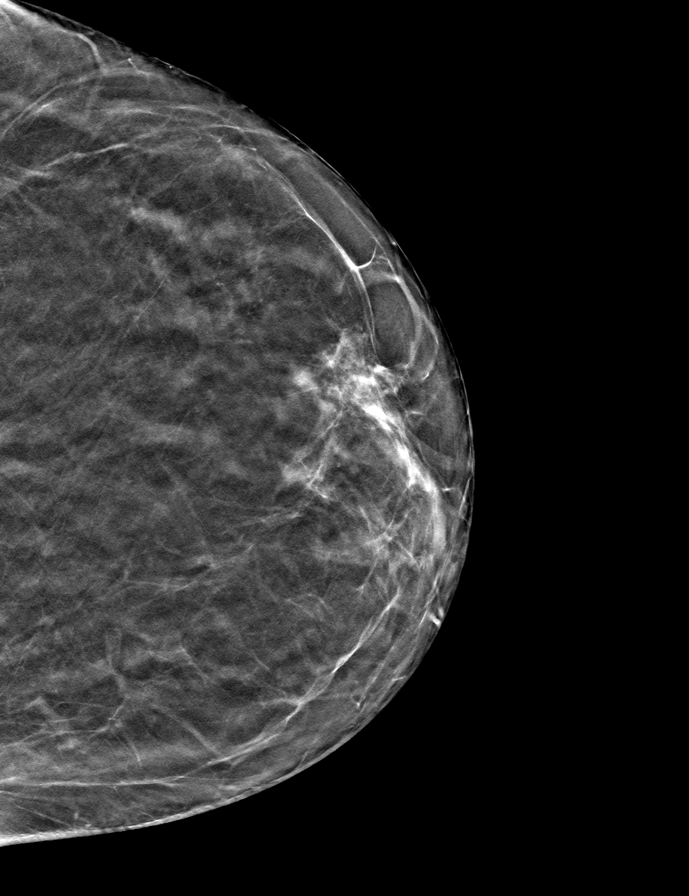

[R CC tomo · tomo slice 30/59.0]
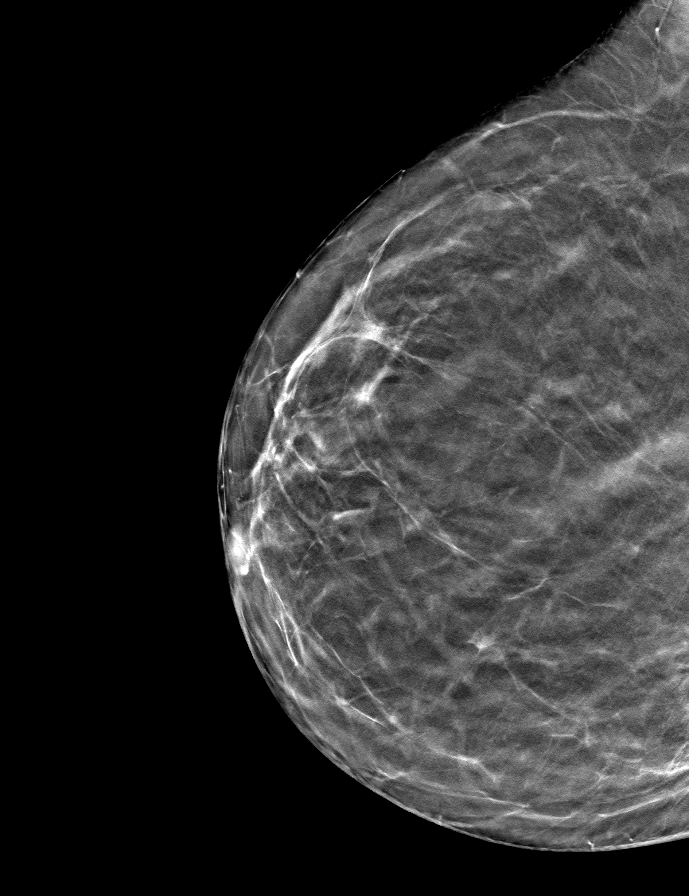

[L MLO tomo · tomo slice 32/63.0]
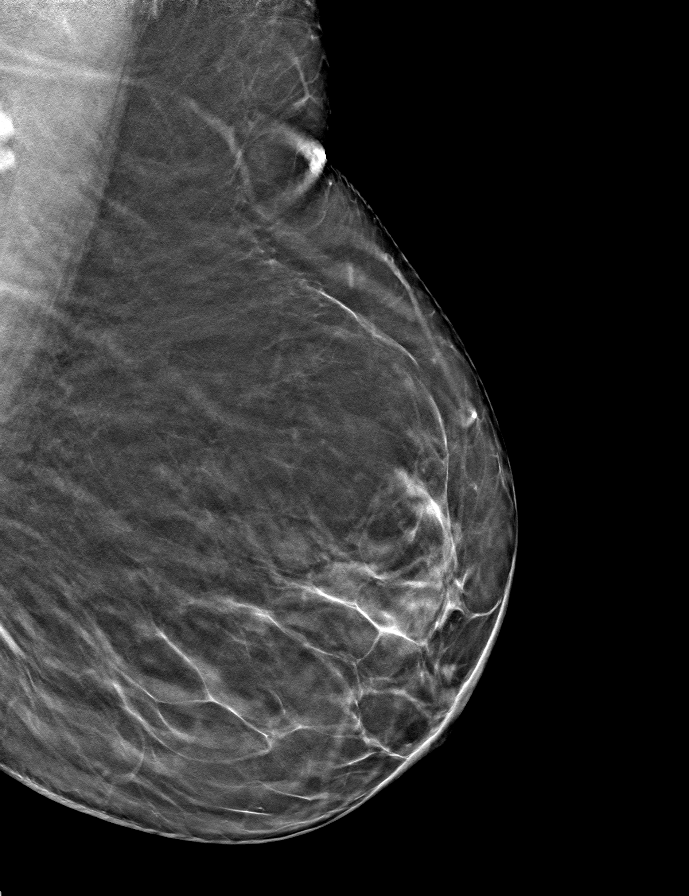

[9 of 24 positions shown; findings below may reference images not displayed]

IMPRESSION: There is no mammographic evidence of malignancy.
Screening mammogram recommended in 1 year.
BI-RADS Category 1: Negative

## 2023-01-03 IMAGING — MG MAMMO SCRN BIL W/CAD TOMO
8 series · 8 of 24 positions shown · non-contrast
Comparison: The present examination has been compared to prior imaging studies.

Images Obtained from [facility]
INDICATION: Screening.
TECHNIQUE: Bilateral 2-D digital screening mammogram was performed followed by 3-D tomosynthesis.  Current study was also evaluated with a computer aided detection (CAD) system.
MAMMOGRAM FINDINGS:
There are scattered areas of fibroglandular density.
No suspicious abnormality is seen in either breast.  There are no significant changes from the prior study.

[R MLO]
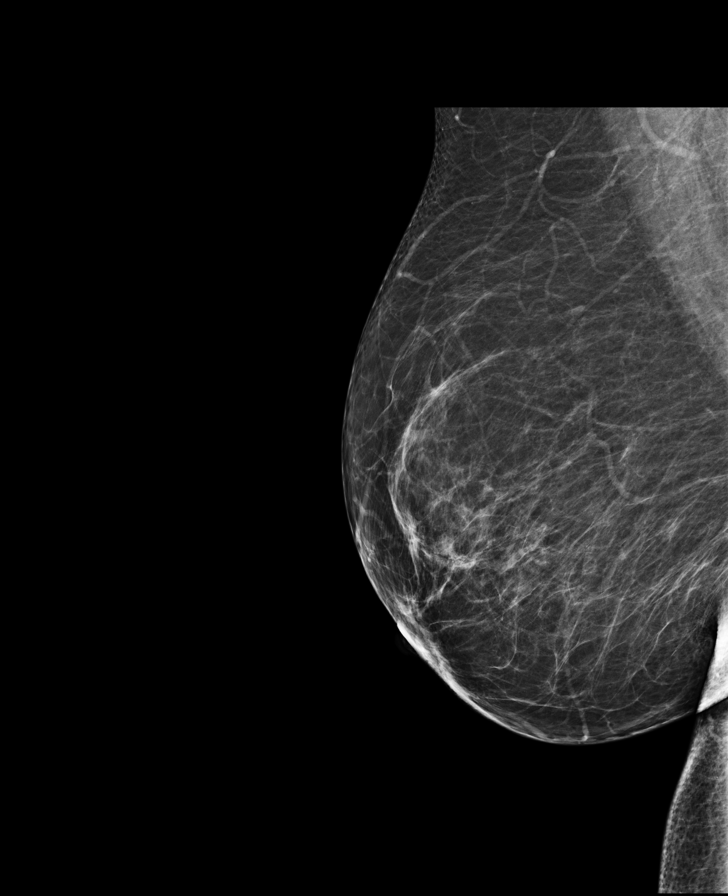

[R CC]
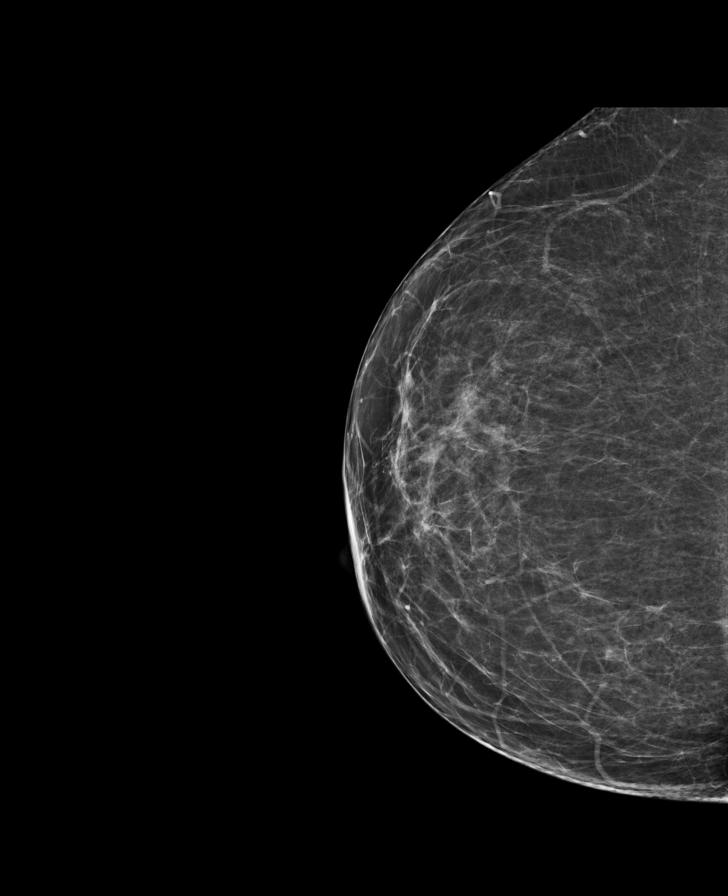

[L CC]
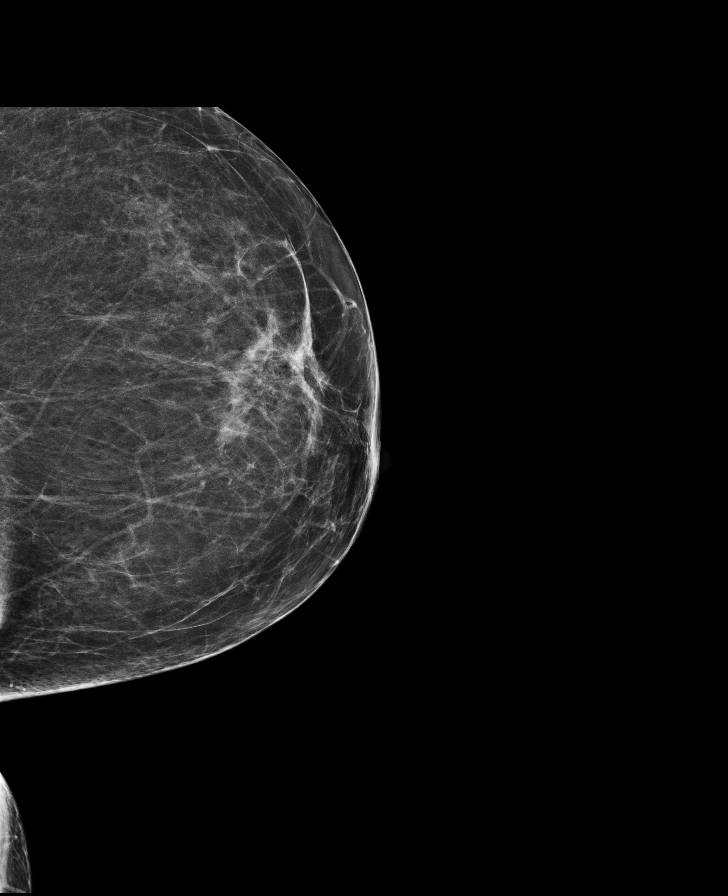

[L MLO]
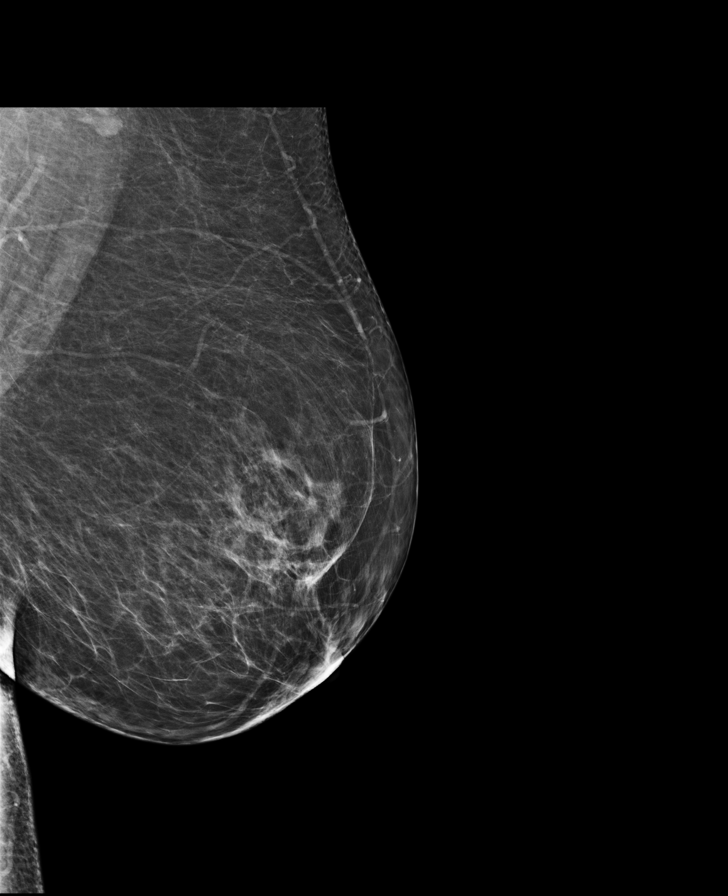

[R CC tomo · tomo slice 35/68.0]
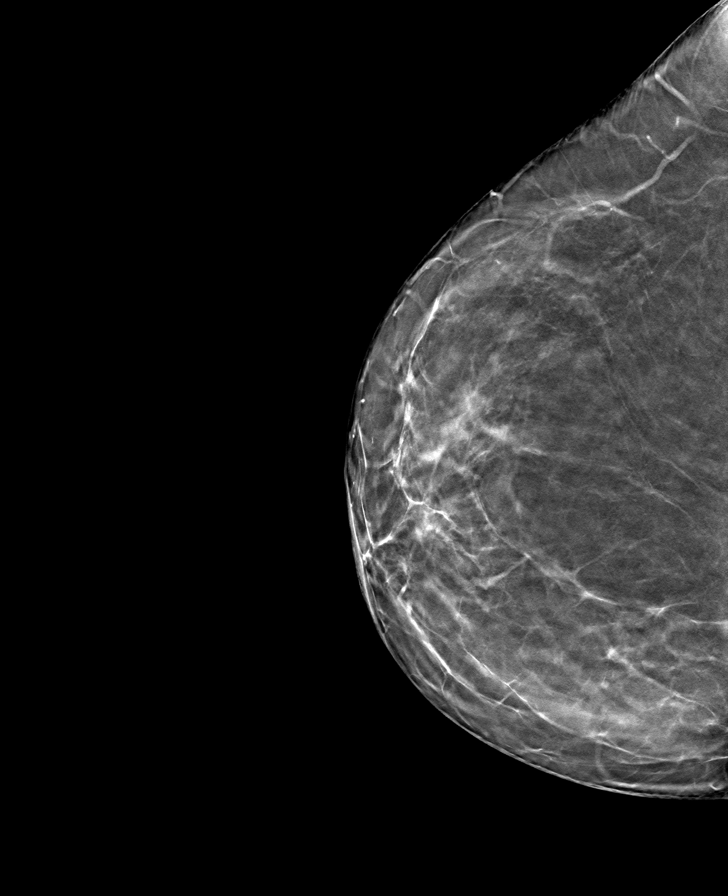

[R MLO tomo · tomo slice 43/84.0]
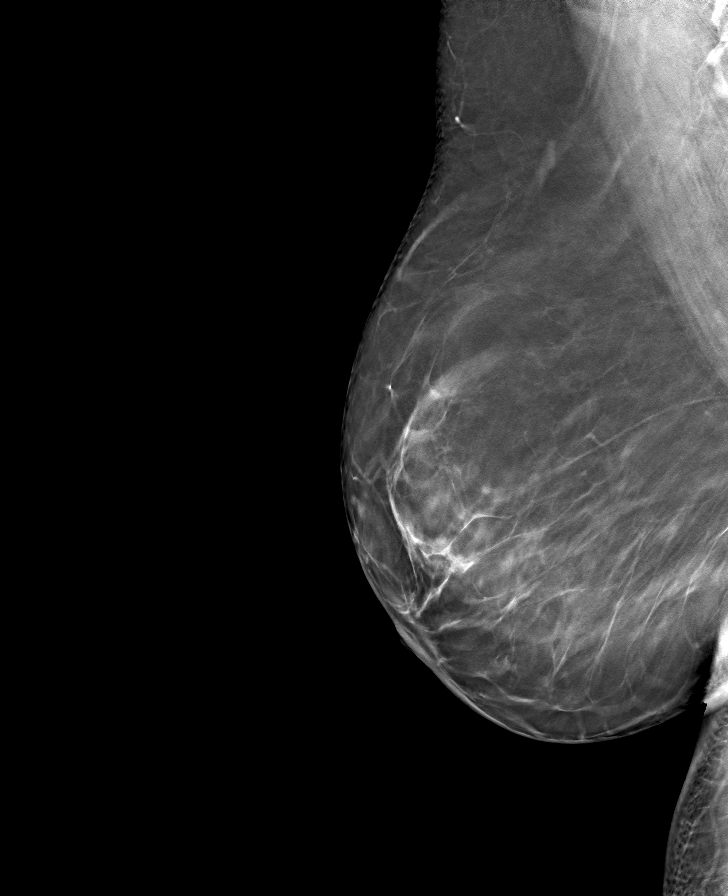

[L CC tomo · tomo slice 35/70.0]
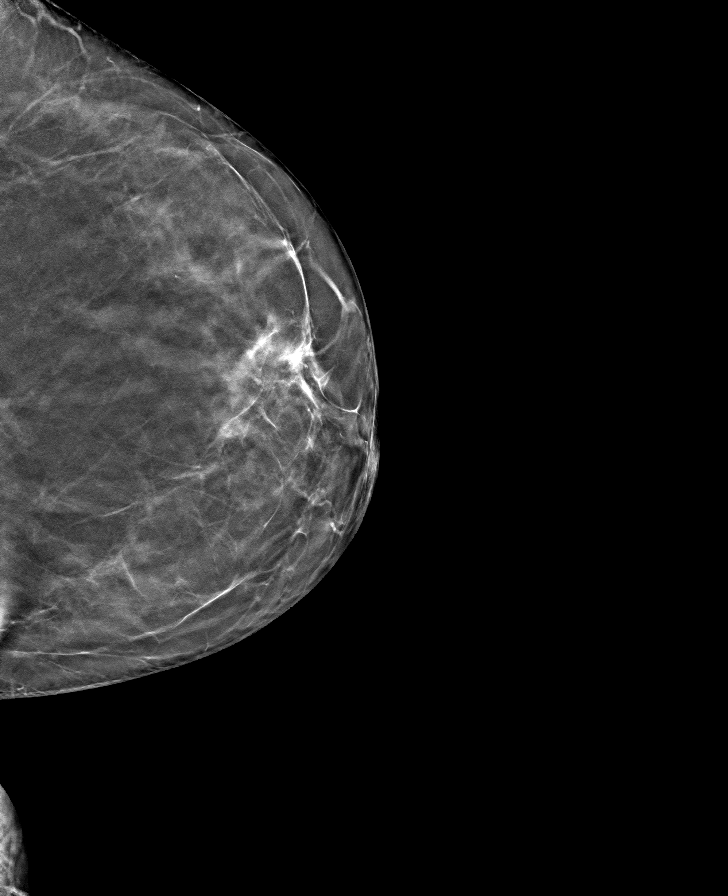

[L MLO tomo · tomo slice 43/84.0]
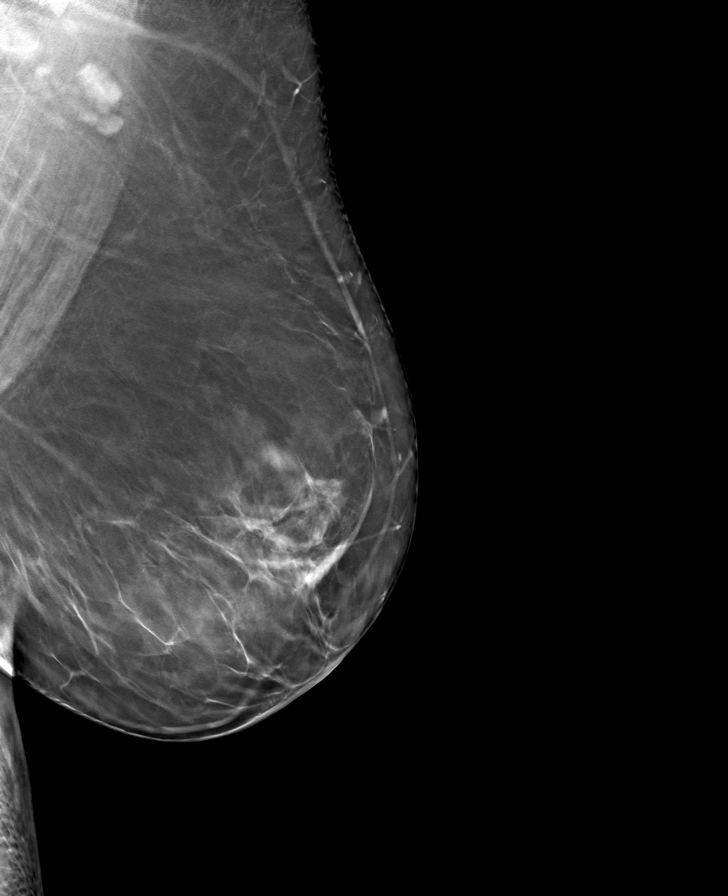

[8 of 24 positions shown; findings below may reference images not displayed]

IMPRESSION: There is no mammographic evidence of malignancy.
Screening mammogram recommended in 1 year.
BI-RADS Category 1: Negative
